# Patient Record
Sex: Female | Born: 1990 | Race: White | Hispanic: No | Marital: Single | Smoking: Current some day smoker
Health system: Southern US, Community
[De-identification: ages and names within clinical notes are randomized; demographics above are authoritative.]

## PROBLEM LIST (undated history)

## (undated) ENCOUNTER — Inpatient Hospital Stay (HOSPITAL_COMMUNITY): Payer: Self-pay

## (undated) DIAGNOSIS — Z789 Other specified health status: Secondary | ICD-10-CM

## (undated) HISTORY — PX: EYE SURGERY: SHX253

## (undated) HISTORY — PX: INDUCED ABORTION: SHX677

---

## 2012-04-11 ENCOUNTER — Encounter (HOSPITAL_COMMUNITY): Payer: Self-pay | Admitting: *Deleted

## 2012-04-11 ENCOUNTER — Emergency Department (HOSPITAL_COMMUNITY)
Admission: EM | Admit: 2012-04-11 | Discharge: 2012-04-12 | Disposition: A | Discharge status: Home / Self Care | Payer: Self-pay | Attending: Emergency Medicine | Admitting: Emergency Medicine

## 2012-04-11 DIAGNOSIS — F458 Other somatoform disorders: Secondary | ICD-10-CM | POA: Insufficient documentation

## 2012-04-11 DIAGNOSIS — R51 Headache: Secondary | ICD-10-CM | POA: Insufficient documentation

## 2012-04-11 DIAGNOSIS — F444 Conversion disorder with motor symptom or deficit: Secondary | ICD-10-CM

## 2012-04-11 DIAGNOSIS — R1110 Vomiting, unspecified: Secondary | ICD-10-CM | POA: Insufficient documentation

## 2012-04-11 DIAGNOSIS — F17200 Nicotine dependence, unspecified, uncomplicated: Secondary | ICD-10-CM | POA: Insufficient documentation

## 2012-04-11 LAB — URINE MICROSCOPIC-ADD ON

## 2012-04-11 LAB — URINALYSIS, ROUTINE W REFLEX MICROSCOPIC
Bilirubin Urine: NEGATIVE
Glucose, UA: NEGATIVE mg/dL
Ketones, ur: NEGATIVE mg/dL
Nitrite: NEGATIVE
Protein, ur: NEGATIVE mg/dL

## 2012-04-11 NOTE — ED Provider Notes (Signed)
History     CSN: [id number]  Arrival date & time 04/11/12  2040   First MD Initiated Contact with Patient 04/11/12 2256      Chief Complaint  Patient presents with  . shaking all over     (Consider location/radiation/quality/duration/timing/severity/associated sxs/prior treatment) The history is provided by the patient and medical records. No language interpreter was used.    Allison [name] is a 22 y.o. female  with no medical Hx presents to the Emergency Department complaining of gradual, persistent, leg tremors beginning this evening. Patient states she had one episode of vomiting after eating dinner associated with a brief episode of left lower cautery pain which has resolved.  Patient states her legs began to shake approximately 3 hours ago and she's been unable to control them. Patient has no history of seizures is no family history of seizure disorder. Patient is at everyday smoker, denies alcohol intake last night but denies street drug use. Patient also has mild lower back pain and headache which has now resolved.. She denies episodes of bowel or bladder incontinence, saddle anesthesia. Patient boyfriend states that she is not walking normally. Nothing makes it better and nothing makes it worse.  Pt denies fever, chills, neck pain, chest pain, shortness of breath, nausea, diarrhea, weakness, dizziness, syncope, numbness, tingling, dysuria, hematuria.  Patient is up-to-date on her immunizations as far she knows.  She's had no recent illness.     History reviewed. No pertinent past medical history.  History reviewed. No pertinent past surgical history.  No family history on file.  History  Substance Use Topics  . Smoking status: Current Every Day Smoker  . Smokeless tobacco: Not on file  . Alcohol Use: No    OB History   Grav Para Term Preterm Abortions TAB SAB Ect Mult Living                  Review of Systems  Constitutional: Negative for fever, diaphoresis, appetite  change, fatigue and unexpected weight change.  HENT: Negative for mouth sores and neck stiffness.   Eyes: Negative for visual disturbance.  Respiratory: Negative for cough, chest tightness, shortness of breath and wheezing.   Cardiovascular: Negative for chest pain.  Gastrointestinal: Positive for vomiting (x1). Negative for nausea, abdominal pain, diarrhea and constipation.  Endocrine: Negative for polydipsia, polyphagia and polyuria.  Genitourinary: Negative for dysuria, urgency, frequency and hematuria.  Musculoskeletal: Negative for back pain.  Skin: Negative for rash.  Allergic/Immunologic: Negative for immunocompromised state.  Neurological: Positive for tremors and headaches (now resolved). Negative for syncope and light-headedness.  Hematological: Does not bruise/bleed easily.  Psychiatric/Behavioral: Negative for sleep disturbance. The patient is not nervous/anxious.     Allergies  Review of patient's allergies indicates no known allergies.  Home Medications  No current outpatient prescriptions on file.  BP 123/74  [name] 74  Temp(Src) 98.4 F (36.9 C) (Oral)  Resp 16  SpO2 100%  LMP 03/17/2012  Physical Exam  Nursing note and vitals reviewed. Constitutional: She is oriented to person, place, and time. She appears well-developed and well-nourished. No distress.  HENT:  Head: Normocephalic and atraumatic.  Right Ear: Tympanic membrane, external ear and ear canal normal.  Left Ear: Tympanic membrane, external ear and ear canal normal.  Nose: Nose normal. No mucosal edema or rhinorrhea.  Mouth/Throat: Uvula is midline, oropharynx is clear and moist and mucous membranes are normal. Normal dentition. No edematous. No oropharyngeal exudate, posterior oropharyngeal edema, posterior oropharyngeal erythema or tonsillar abscesses.  Eyes: Conjunctivae and EOM are normal. Pupils are equal, round, and reactive to light. No scleral icterus. Right eye exhibits no nystagmus. Left eye  exhibits no nystagmus.  Neck: Normal range of motion. Neck supple.  Cardiovascular: Normal rate, regular rhythm, normal heart sounds and intact distal pulses.   Capillary refill less than 3 seconds  Pulmonary/Chest: Effort normal and breath sounds normal. No respiratory distress. She has no wheezes. She has no rales. She exhibits no tenderness.  Abdominal: Soft. Bowel sounds are normal. She exhibits no distension and no mass. There is no tenderness. There is no rebound and no guarding.  Musculoskeletal: Normal range of motion. She exhibits tenderness. She exhibits no edema.  Mild L-spine paraspinal tenderness Full range of motion of the L-spine  Lymphadenopathy:    She has no cervical adenopathy.  Neurological: She is alert and oriented to person, place, and time. She has normal reflexes. No cranial nerve deficit. She exhibits normal muscle tone. Coordination normal.  Speech is clear and goal oriented, follows commands Major Cranial nerves without deficit, no facial droop Normal strength in upper and lower extremities bilaterally including dorsiflexion and plantar flexion, strong and equal grip strength Sensation normal to light and sharp touch Coordination intact Normal finger to nose and rapid alternating movements Neg romberg, no pronator drift Normal balance Pt walks without difficulty but tremor in legs is noted as she ambulates   Skin: Skin is warm and dry. No rash noted. She is not diaphoretic. No erythema.  Psychiatric: She has a normal mood and affect. Her behavior is normal.    ED Course  Procedures (including critical care time)  Labs Reviewed  URINALYSIS, ROUTINE W REFLEX MICROSCOPIC - Abnormal; Notable for the following:    APPearance TURBID (*)    Leukocytes, UA LARGE (*)    All other components within normal limits  URINE MICROSCOPIC-ADD ON - Abnormal; Notable for the following:    Squamous Epithelial / LPF MANY (*)    Bacteria, UA MANY (*)    All other components  within normal limits  URINE CULTURE  PREGNANCY, URINE  URINE RAPID DRUG SCREEN (HOSP PERFORMED)  POCT I-STAT, CHEM 8   No results found.   1. Psychogenic tremor       MDM  Allison [name] presents with several complaints with her biggest concern being the tremor in her legs.  Pt with normal neurologic exam, but visible tremor in her legs even with walking. This tremor is not intention in nature.  Pt denies drug use and her UDS is negative.  Chem 8 unremarkable.  Pt with contaminated urine with WBCs, bacteria and leukocytes; no nitrites and pt is without dysuria, hematuria, frequency, urgency or other urinary complaints. No CVA tenderness on exam.  Pt also denies vaginal discharge or itching.  Will culture urine and treat based on culture results.  Pregnancy test negative.  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  Pt will be given f/u with neurology as needed if symptoms worsen. Discussed at length stress reduction techniques. I have also discussed reasons to return immediately to the ER.  Patient expresses understanding and agrees with plan.          Dahlia Client Albertine Lafoy, PA-C 04/12/12 [phone]

## 2012-04-11 NOTE — ED Notes (Signed)
The pt reports that she has been shaking for the past 3 hours.  She last ate at 1430  Today.  She vomited once earlier today and earlier today she had some llq pain with radiation down her lt leg.  lmp march 11

## 2012-04-12 DIAGNOSIS — F458 Other somatoform disorders: Secondary | ICD-10-CM

## 2012-04-12 LAB — POCT I-STAT, CHEM 8
BUN: 7 mg/dL (ref 6–23)
Calcium, Ion: 1.21 mmol/L (ref 1.12–1.23)
HCT: 41 % (ref 36.0–46.0)
Hemoglobin: 13.9 g/dL (ref 12.0–15.0)
Sodium: 140 mEq/L (ref 135–145)
TCO2: 25 mmol/L (ref 0–100)

## 2012-04-12 LAB — RAPID URINE DRUG SCREEN, HOSP PERFORMED
Barbiturates: NOT DETECTED
Benzodiazepines: NOT DETECTED
Cocaine: NOT DETECTED
Tetrahydrocannabinol: NOT DETECTED

## 2012-04-12 LAB — GLUCOSE, CAPILLARY: Glucose-Capillary: 78 mg/dL (ref 70–99)

## 2012-04-12 NOTE — ED Notes (Signed)
Neurology at bedside.

## 2012-04-12 NOTE — ED Provider Notes (Signed)
Medical screening examination/treatment/procedure(s) were performed by non-physician practitioner and as supervising physician I was immediately available for consultation/collaboration.  Sunnie Nielsen, MD 04/12/12 [phone]

## 2012-04-12 NOTE — Consult Note (Signed)
Reason for Consult:Abnormal Lower extremity movements.  Referring Physician: Dierdre Highman, B  CC: Lower extremity shaking.   History is obtained from:Patient  HPI: Allison [name] is a 22 y.o. female with no significant past medical history who was in the shower earlier tonight and noticed a left groin pain that shot down into her leg and then subsequently noticed that her legs were shaking. She states that they were shaking so bad earlier that she could not continue to stand in the shower. Since that time, she continues to have shaking of her legs.    ROS: A 14 point ROS was performed and is negative except as noted in the HPI.  History reviewed. No pertinent past medical history.  Social History: Tob: + smoker  Exam: Current vital signs: BP 123/74  Pulse 74  Temp(Src) 98.4 F (36.9 C) (Oral)  Resp 16  SpO2 100%  LMP 03/17/2012 Vital signs in last 24 hours: Temp:  [98.4 F (36.9 C)] 98.4 F (36.9 C) (04/05 2051) Pulse Rate:  [73-84] 74 (04/06 0045) Resp:  [16] 16 (04/05 2051) BP: (119-141)/(74-91) 123/74 mmHg (04/06 0045) SpO2:  [100 %] 100 % (04/06 0045)  General: in bed, nad CV: rrr Mental Status: Patient is awake, alert, oriented to person, place, month, year, and situation. Immediate and remote memory are intact. Patient is able to give a clear and coherent history. No signs of aphasia or neglect.  Cranial Nerves: II: Visual Fields are full. Pupils are equal, round, and reactive to light.  Discs are sharp.  III,IV, VI: EOMI without ptosis or diploplia.  V: Facial sensation is symmetric to temperature VII: Facial movement is symmetric.  VIII: hearing is intact to voice X: Uvula elevates symmetrically XI: Shoulder shrug is symmetric. XII: tongue is midline without atrophy or fasciculations.  Motor: Tone is normal. Bulk is normal. 5/5 strength was present in all four extremities. She has an irregular tremor of the bilateral lower extremities that is arhythmic,  dyssynchronous, and clearly distractible.  Sensory: Sensation is symmetric to light touch in the arms and legs. Deep Tendon Reflexes: 2+ and symmetric in the biceps and patellae.  Cerebellar: FNF  intact bilaterally Gait: Patient able to stand on tiptoes and take a couple of steps, more detailed testing difficult due to monitors.    I have reviewed labs in epic and the results pertinent to this consultation are: BMP - wnl UDS - negative  Impression: 22 yo F with distractible lower extremity tremor. The character of the tremor, bilateral location,  lack of associated symptoms and distractible nature argue for a psychogenic nature to this illness. I do not feel that any further evaluation is needed at this time.   Recommendations: 1) Patient could follow up with neurology as outpatient if she continues to have symptoms.    Ritta Slot, MD [facility] [phone]  If 7pm- 7am, please page neurology on call at [phone].

## 2012-04-13 LAB — URINE CULTURE: Colony Count: 40000

## 2012-04-14 ENCOUNTER — Telehealth (HOSPITAL_COMMUNITY): Payer: Self-pay | Admitting: Emergency Medicine

## 2012-04-15 ENCOUNTER — Telehealth (HOSPITAL_COMMUNITY): Payer: Self-pay | Admitting: Emergency Medicine

## 2012-08-29 ENCOUNTER — Encounter (HOSPITAL_BASED_OUTPATIENT_CLINIC_OR_DEPARTMENT_OTHER): Payer: Self-pay

## 2012-08-29 ENCOUNTER — Emergency Department (HOSPITAL_BASED_OUTPATIENT_CLINIC_OR_DEPARTMENT_OTHER)
Admission: EM | Admit: 2012-08-29 | Discharge: 2012-08-29 | Disposition: A | Discharge status: Home / Self Care | Payer: Self-pay | Attending: Emergency Medicine | Admitting: Emergency Medicine

## 2012-08-29 DIAGNOSIS — R630 Anorexia: Secondary | ICD-10-CM | POA: Insufficient documentation

## 2012-08-29 DIAGNOSIS — R0602 Shortness of breath: Secondary | ICD-10-CM | POA: Insufficient documentation

## 2012-08-29 DIAGNOSIS — Z3202 Encounter for pregnancy test, result negative: Secondary | ICD-10-CM | POA: Insufficient documentation

## 2012-08-29 DIAGNOSIS — R059 Cough, unspecified: Secondary | ICD-10-CM | POA: Insufficient documentation

## 2012-08-29 DIAGNOSIS — F17200 Nicotine dependence, unspecified, uncomplicated: Secondary | ICD-10-CM | POA: Insufficient documentation

## 2012-08-29 DIAGNOSIS — J3489 Other specified disorders of nose and nasal sinuses: Secondary | ICD-10-CM | POA: Insufficient documentation

## 2012-08-29 DIAGNOSIS — R109 Unspecified abdominal pain: Secondary | ICD-10-CM | POA: Insufficient documentation

## 2012-08-29 DIAGNOSIS — R05 Cough: Secondary | ICD-10-CM | POA: Insufficient documentation

## 2012-08-29 LAB — URINE MICROSCOPIC-ADD ON

## 2012-08-29 LAB — URINALYSIS, ROUTINE W REFLEX MICROSCOPIC
Bilirubin Urine: NEGATIVE
Ketones, ur: NEGATIVE mg/dL
Nitrite: NEGATIVE
Protein, ur: NEGATIVE mg/dL
Urobilinogen, UA: 1 mg/dL (ref 0.0–1.0)

## 2012-08-29 MED ORDER — IBUPROFEN 400 MG PO TABS
600.0000 mg | ORAL_TABLET | Freq: Once | ORAL | Status: AC
  Administered 2012-08-29: 600 mg via ORAL
  Filled 2012-08-29: qty 1

## 2012-08-29 MED ORDER — IBUPROFEN 600 MG PO TABS: 600.0000 mg | ORAL_TABLET | Freq: Three times a day (TID) | ORAL | Status: DC | PRN

## 2012-08-29 MED ORDER — HYDROCODONE-ACETAMINOPHEN 5-325 MG PO TABS
1.0000 | ORAL_TABLET | Freq: Once | ORAL | Status: AC
  Administered 2012-08-29: 1 via ORAL
  Filled 2012-08-29: qty 1

## 2012-08-29 NOTE — ED Notes (Signed)
Patient here with abdominal cramping x 3 days. Reports that she thinks it is menstrual cramps but just finished cycle 2 weeks ago. Reports that the pain is radiating down her legs

## 2012-08-29 NOTE — ED Provider Notes (Signed)
CSN: [id number]     Arrival date & time 08/29/12  1845 History    This chart was scribed for Lyanne Co, MD,  by Ashley Jacobs, ED Scribe. The patient was seen in room MHOTF/OTF and the patient's care was started at 9:05 PM.   First MD Initiated Contact with Patient 08/29/12 2029     Chief Complaint  Patient presents with  . Abdominal Cramping   HPI Patient is a 22 y.o. female presenting with cramps.  Abdominal Cramping This is a new problem. The current episode started more than 2 days ago. The problem occurs constantly. The problem has not changed since onset.Associated symptoms include abdominal pain and shortness of breath. Pertinent negatives include no chest pain. She has tried nothing for the symptoms.  HPI Comments: Allison [name] is a 22 y.o. female who presents to the Emergency Department complaining of abdominal cramping with initial onset of three days ago and has not resolved. Pt mention feeling intermittent moderate pain in her right abdomen and suprapubic region. She reports having a her menses 2 weeks ago and reports that the pain she is experiencing is not similar in nature. She did not take any medication for pain and reports that nothing seems to relieve or worsen symptoms.Pt does not have any know allergies.  She denies dysuria.  No vaginal complaints.  No vaginal discharge.  History reviewed. No pertinent past medical history. History reviewed. No pertinent past surgical history. No family history on file. History  Substance Use Topics  . Smoking status: Current Every Day Smoker  . Smokeless tobacco: Not on file  . Alcohol Use: No   OB History   Grav Para Term Preterm Abortions TAB SAB Ect Mult Living                 Review of Systems  Constitutional: Positive for appetite change. Negative for fever and chills.  HENT: Positive for congestion.   Respiratory: Positive for cough and shortness of breath.   Cardiovascular: Negative for chest pain.   Gastrointestinal: Positive for abdominal pain. Negative for nausea, vomiting and diarrhea.  Genitourinary: Negative for dysuria, frequency, vaginal discharge, vaginal pain, menstrual problem and dyspareunia.  Musculoskeletal: Negative for back pain.  All other systems reviewed and are negative.    Allergies  Review of patient's allergies indicates no known allergies.  Home Medications   Current Outpatient Rx  Name  Route  Sig  Dispense  Refill  . ibuprofen (ADVIL,MOTRIN) 600 MG tablet   Oral   Take 1 tablet (600 mg total) by mouth every 8 (eight) hours as needed for pain.   15 tablet   0    BP 129/71  Pulse 100  Temp(Src) 98.6 F (37 C) (Oral)  Resp 18  Wt 112 lb (50.803 kg)  SpO2 99%  LMP 08/15/2012 Physical Exam  Nursing note and vitals reviewed. Constitutional: She is oriented to person, place, and time. She appears well-developed and well-nourished. No distress.  HENT:  Head: Normocephalic and atraumatic.  Eyes: Conjunctivae and EOM are normal. Pupils are equal, round, and reactive to light.  Neck: Normal range of motion. Neck supple.  Cardiovascular: Normal rate.   Pulmonary/Chest: Effort normal.  Abdominal: Soft. Bowel sounds are normal. She exhibits no distension. There is no rebound and no guarding.  Musculoskeletal: Normal range of motion.  Neurological: She is alert and oriented to person, place, and time.  Skin: Skin is warm and dry.  Psychiatric: She has a normal mood and affect.  Her behavior is normal.    ED Course  DIAGNOSTIC STUDIES: Oxygen Saturation is 99% on room air, normal by my interpretation.    COORDINATION OF CARE: 9:08 PM Discussed course of care with pt . Pt understands and agrees.    Procedures (including critical care time)  Labs Reviewed  URINALYSIS, ROUTINE W REFLEX MICROSCOPIC - Abnormal; Notable for the following:    Leukocytes, UA SMALL (*)    All other components within normal limits  PREGNANCY, URINE  URINE  MICROSCOPIC-ADD ON   No results found. 1. Abdominal pain     MDM  Patient is overall well-appearing.  Her abdominal exam is benign.  There is no indication for imaging today.  Her urine shows no signs of infection.  Discharge home in good condition.  I've asked that she return to the ER for developing fever or worsening pain.   I personally performed the services described in this documentation, which was scribed in my presence. The recorded information has been reviewed and is accurate.      Lyanne Co, MD 08/29/12 [phone]

## 2012-08-30 ENCOUNTER — Emergency Department (HOSPITAL_BASED_OUTPATIENT_CLINIC_OR_DEPARTMENT_OTHER)
Admission: EM | Admit: 2012-08-30 | Discharge: 2012-08-30 | Disposition: A | Discharge status: Home / Self Care | Payer: Self-pay | Attending: Emergency Medicine | Admitting: Emergency Medicine

## 2012-08-30 ENCOUNTER — Encounter (HOSPITAL_BASED_OUTPATIENT_CLINIC_OR_DEPARTMENT_OTHER): Payer: Self-pay | Admitting: *Deleted

## 2012-08-30 DIAGNOSIS — F17200 Nicotine dependence, unspecified, uncomplicated: Secondary | ICD-10-CM | POA: Insufficient documentation

## 2012-08-30 DIAGNOSIS — Z3202 Encounter for pregnancy test, result negative: Secondary | ICD-10-CM | POA: Insufficient documentation

## 2012-08-30 DIAGNOSIS — R109 Unspecified abdominal pain: Secondary | ICD-10-CM | POA: Insufficient documentation

## 2012-08-30 LAB — URINALYSIS, ROUTINE W REFLEX MICROSCOPIC
Bilirubin Urine: NEGATIVE
Hgb urine dipstick: NEGATIVE
Ketones, ur: NEGATIVE mg/dL
Specific Gravity, Urine: 1.018 (ref 1.005–1.030)
pH: 7.5 (ref 5.0–8.0)

## 2012-08-30 LAB — CBC WITH DIFFERENTIAL/PLATELET
Eosinophils Absolute: 0.3 10*3/uL (ref 0.0–0.7)
HCT: 40.4 % (ref 36.0–46.0)
Hemoglobin: 13.5 g/dL (ref 12.0–15.0)
Lymphs Abs: 2.5 10*3/uL (ref 0.7–4.0)
MCH: 30.7 pg (ref 26.0–34.0)
MCHC: 33.4 g/dL (ref 30.0–36.0)
Monocytes Absolute: 0.7 10*3/uL (ref 0.1–1.0)
Monocytes Relative: 8 % (ref 3–12)
Neutrophils Relative %: 60 % (ref 43–77)
RBC: 4.4 MIL/uL (ref 3.87–5.11)

## 2012-08-30 LAB — BASIC METABOLIC PANEL
BUN: 9 mg/dL (ref 6–23)
Chloride: 103 mEq/L (ref 96–112)
Creatinine, Ser: 0.8 mg/dL (ref 0.50–1.10)
GFR calc non Af Amer: >90 mL/min (ref >90)
Glucose, Bld: 95 mg/dL (ref 70–99)
Potassium: 3.9 mEq/L (ref 3.5–5.1)

## 2012-08-30 NOTE — ED Notes (Addendum)
Patient was seen in this facility yesterday. States that her abd pain is worse today. Patient asked if she could drink her sweet tea now, informed her that she would need to wait until she saw the dr.

## 2012-08-30 NOTE — ED Provider Notes (Signed)
CSN: [id number]     Arrival date & time 08/30/12  1749 History     First MD Initiated Contact with Patient 08/30/12 1825     Chief Complaint  Patient presents with  . Abdominal Pain   (Consider location/radiation/quality/duration/timing/severity/associated sxs/prior Treatment) HPI Comments: Patient is a 22 year old female with no past medical history who presents with lower abdominal pain for the past 3 days. The pain is located in her lower abdomen and does not radiate. The pain is described as cramping and severe. The pain started gradually and progressively worsened since the onset. No alleviating/aggravating factors. The patient has tried nothing for symptoms without relief. Associated symptoms include nothing. Patient denies fever, headache, NVD, chest pain, SOB, dysuria, constipation, abnormal vaginal bleeding/discharge. LMP 2 weeks ago.     Patient is a 22 y.o. female presenting with abdominal pain.  Abdominal Pain   History reviewed. No pertinent past medical history. History reviewed. No pertinent past surgical history. History reviewed. No pertinent family history. History  Substance Use Topics  . Smoking status: Current Every Day Smoker  . Smokeless tobacco: Not on file  . Alcohol Use: No   OB History   Grav Para Term Preterm Abortions TAB SAB Ect Mult Living                 Review of Systems  Gastrointestinal: Positive for abdominal pain.  All other systems reviewed and are negative.    Allergies  Review of patient's allergies indicates no known allergies.  Home Medications   Current Outpatient Rx  Name  Route  Sig  Dispense  Refill  . ibuprofen (ADVIL,MOTRIN) 600 MG tablet   Oral   Take 1 tablet (600 mg total) by mouth every 8 (eight) hours as needed for pain.   15 tablet   0    BP 110/64  Pulse 85  Temp(Src) 98.3 F (36.8 C) (Oral)  Resp 20  SpO2 99%  LMP 08/15/2012 Physical Exam  Nursing note and vitals reviewed. Constitutional: She is  oriented to person, place, and time. She appears well-developed and well-nourished. No distress.  HENT:  Head: Normocephalic and atraumatic.  Eyes: Conjunctivae and EOM are normal.  Neck: Normal range of motion.  Cardiovascular: Normal rate and regular rhythm.  Exam reveals no gallop and no friction rub.   No murmur heard. Pulmonary/Chest: Effort normal and breath sounds normal. She has no wheezes. She has no rales. She exhibits no tenderness.  Abdominal: Soft. She exhibits no distension. There is tenderness. There is no rebound and no guarding.  Generalized lower abdominal tenderness to palpation. No focal tenderness to palpation or peritoneal signs.   Musculoskeletal: Normal range of motion.  Neurological: She is alert and oriented to person, place, and time.  Speech is goal-oriented. Moves limbs without ataxia.   Skin: Skin is warm and dry.  Psychiatric: She has a normal mood and affect. Her behavior is normal.    ED Course   Procedures (including critical care time)  Labs Reviewed  URINALYSIS, ROUTINE W REFLEX MICROSCOPIC - Abnormal; Notable for the following:    Leukocytes, UA SMALL (*)    All other components within normal limits  URINE MICROSCOPIC-ADD ON - Abnormal; Notable for the following:    Squamous Epithelial / LPF FEW (*)    All other components within normal limits  URINE CULTURE  CBC WITH DIFFERENTIAL  BASIC METABOLIC PANEL  PREGNANCY, URINE   No results found. 1. Abdominal pain     MDM  8:31 PM Labs unremarkable. Urinalysis shows no infection. Patient declined pelvic exam. She thinks her lower abdominal pain is due to muscle soreness from her working out. No further evaluation needed at this time. Patient will be discharged.   Emilia Beck, [place] 08/30/12 2035

## 2012-08-30 NOTE — ED Notes (Signed)
PA at bedside.

## 2012-09-01 LAB — URINE CULTURE: Colony Count: NO GROWTH

## 2012-09-03 NOTE — ED Provider Notes (Signed)
Medical screening examination/treatment/procedure(s) were performed by non-physician practitioner and as supervising physician I was immediately available for consultation/collaboration.  Mersades Barbaro, MD 09/03/12 0847 

## 2013-01-07 NOTE — L&D Delivery Note (Signed)
I was present for the delivery and agree with above.  Placenta to: BS Feeding: Breast Circ: NA Contraception: Nexplanon  Dorathy Kinsman, CNM 09/20/2013 10:55 AM

## 2013-01-07 NOTE — L&D Delivery Note (Signed)
Delivery Note MD called to room. Delivery went precipitously and RN delivery baby as MD entered room.  At 8:33 AM a viable female was delivered via Vaginal, Spontaneous Delivery (Presentation: Right Occiput Anterior).  APGAR:9 ,10 ; weight pending.   Placenta status: Intact, Spontaneous.  Cord: 3 vessel; with the following complications: none .  Cord pH: pending  Anesthesia:  Epidural  Episiotomy: None Lacerations: None Suture Repair: N/A Est. Blood Loss (mL): 150  Mom to postpartum.  Baby to Couplet care / Skin to Skin.  Joanna Puff 09/20/2013, 8:51 AM

## 2013-02-13 ENCOUNTER — Emergency Department (HOSPITAL_COMMUNITY): Payer: Medicaid Other

## 2013-02-13 ENCOUNTER — Encounter (HOSPITAL_COMMUNITY): Payer: Self-pay | Admitting: Emergency Medicine

## 2013-02-13 ENCOUNTER — Emergency Department (HOSPITAL_COMMUNITY)
Admission: EM | Admit: 2013-02-13 | Discharge: 2013-02-14 | Disposition: A | Discharge status: Home / Self Care | Payer: Medicaid Other | Attending: Emergency Medicine | Admitting: Emergency Medicine

## 2013-02-13 DIAGNOSIS — O99330 Smoking (tobacco) complicating pregnancy, unspecified trimester: Secondary | ICD-10-CM | POA: Insufficient documentation

## 2013-02-13 DIAGNOSIS — Z3490 Encounter for supervision of normal pregnancy, unspecified, unspecified trimester: Secondary | ICD-10-CM

## 2013-02-13 DIAGNOSIS — N390 Urinary tract infection, site not specified: Secondary | ICD-10-CM | POA: Insufficient documentation

## 2013-02-13 DIAGNOSIS — O2340 Unspecified infection of urinary tract in pregnancy, unspecified trimester: Secondary | ICD-10-CM

## 2013-02-13 DIAGNOSIS — O211 Hyperemesis gravidarum with metabolic disturbance: Secondary | ICD-10-CM

## 2013-02-13 DIAGNOSIS — O2390 Unspecified genitourinary tract infection in pregnancy, unspecified trimester: Secondary | ICD-10-CM | POA: Insufficient documentation

## 2013-02-13 LAB — URINALYSIS, ROUTINE W REFLEX MICROSCOPIC
Glucose, UA: NEGATIVE mg/dL
Hgb urine dipstick: NEGATIVE
NITRITE: NEGATIVE
PROTEIN: 30 mg/dL — AB
Specific Gravity, Urine: 1.034 — ABNORMAL HIGH (ref 1.005–1.030)
UROBILINOGEN UA: 1 mg/dL (ref 0.0–1.0)
pH: 5.5 (ref 5.0–8.0)

## 2013-02-13 LAB — URINE MICROSCOPIC-ADD ON

## 2013-02-13 LAB — CBC
HEMATOCRIT: 41.4 % (ref 36.0–46.0)
Hemoglobin: 14.7 g/dL (ref 12.0–15.0)
MCH: 31.7 pg (ref 26.0–34.0)
MCHC: 35.5 g/dL (ref 30.0–36.0)
MCV: 89.2 fL (ref 78.0–100.0)
PLATELETS: 435 10*3/uL — AB (ref 150–400)
RBC: 4.64 MIL/uL (ref 3.87–5.11)
RDW: 12.7 % (ref 11.5–15.5)
WBC: 12.1 10*3/uL — AB (ref 4.0–10.5)

## 2013-02-13 LAB — HCG, QUANTITATIVE, PREGNANCY: hCG, Beta Chain, Quant, S: 53523 m[IU]/mL — ABNORMAL HIGH (ref <5)

## 2013-02-13 LAB — BASIC METABOLIC PANEL
BUN: 13 mg/dL (ref 6–23)
CALCIUM: 9.8 mg/dL (ref 8.4–10.5)
CHLORIDE: 96 meq/L (ref 96–112)
CO2: 20 mEq/L (ref 19–32)
Creatinine, Ser: 0.62 mg/dL (ref 0.50–1.10)
GFR calc non Af Amer: >90 mL/min (ref >90)
Glucose, Bld: 72 mg/dL (ref 70–99)
Potassium: 3.8 mEq/L (ref 3.7–5.3)
Sodium: 135 mEq/L — ABNORMAL LOW (ref 137–147)

## 2013-02-13 LAB — POCT PREGNANCY, URINE: PREG TEST UR: POSITIVE — AB

## 2013-02-13 MED ORDER — PROMETHAZINE HCL 25 MG/ML IJ SOLN
12.5000 mg | Freq: Once | INTRAMUSCULAR | Status: AC
Start: 2013-02-13 — End: 2013-02-13
  Administered 2013-02-13: 12.5 mg via INTRAVENOUS
  Filled 2013-02-13: qty 1

## 2013-02-13 MED ORDER — DEXTROSE 5 % IV SOLN
1.0000 g | Freq: Once | INTRAVENOUS | Status: AC
  Administered 2013-02-13: 1 g via INTRAVENOUS
  Filled 2013-02-13: qty 10

## 2013-02-13 MED ORDER — LACTATED RINGERS IV SOLN
INTRAVENOUS | Status: DC
  Administered 2013-02-13: 21:00:00 via INTRAVENOUS

## 2013-02-13 MED ORDER — ONDANSETRON HCL 4 MG/2ML IJ SOLN
4.0000 mg | Freq: Once | INTRAMUSCULAR | Status: AC
  Administered 2013-02-13: 4 mg via INTRAVENOUS
  Filled 2013-02-13: qty 2

## 2013-02-13 MED ORDER — LACTATED RINGERS IV SOLN
INTRAVENOUS | Status: AC
  Administered 2013-02-13: 20:00:00 via INTRAVENOUS

## 2013-02-13 MED ORDER — FAMOTIDINE IN NACL 20-0.9 MG/50ML-% IV SOLN
20.0000 mg | Freq: Once | INTRAVENOUS | Status: AC
  Administered 2013-02-13: 20 mg via INTRAVENOUS
  Filled 2013-02-13: qty 50

## 2013-02-13 NOTE — ED Notes (Signed)
Emesis x week and half. Believes our pregnancy per home pregnancy tests.

## 2013-02-13 NOTE — ED Notes (Signed)
Portable US at bedside.

## 2013-02-13 NOTE — ED Notes (Signed)
Called US, spoke with Marylene Landngela, she reports the pt is the next on the list.

## 2013-02-13 NOTE — ED Notes (Signed)
Pt still c/o nausea, pt hasn't vomited. Reports burning sensation in stomach has unchanged.

## 2013-02-13 NOTE — ED Provider Notes (Signed)
CSN: [id number]     Arrival date & time 02/13/13  1918 History  This chart was scribed for non-physician practitioner Janne NapoleonHope M Neese, NP working with No att. providers found by Valera CastleSteven Perry, ED scribe. This patient was seen in room TR07C/TR07C and the patient's care was started at 7:36 PM.   Chief Complaint  Patient presents with  . Emesis  . Possible Pregnancy   The history is provided by the patient. No language interpreter was used.   HPI Comments: Allison [name] is a 23 y.o. female who presents to the Emergency Department complaining of nausea and multiple episodes of vomiting, onset 1.5 weeks ago. She reports vomiting more than 8 times a day. She reports difficulty keeping down food and liquids. She reports having nausea and vomiting with her previous pregnancy of her 543.23 year old son. She reports having a positive home pregnancy test 3 days ago. She reports having abortion 12/20/2012 and denies having a normal menstrual period since then. She was [redacted] weeks gestation at the time of the abortion. She has been sexually active without birth control since the abortion. She did not go for her follow up appointment after the procedure. She denies lower abdominal pain, only epigastric pain with vomiting. No other associated symptoms.   PCP - No PCP Per Patient  History reviewed. No pertinent past medical history. History reviewed. No pertinent past surgical history. No family history on file. History  Substance Use Topics  . Smoking status: Current Every Day Smoker  . Smokeless tobacco: Not on file  . Alcohol Use: No   OB History   Grav Para Term Preterm Abortions TAB SAB Ect Mult Living                 Review of Systems  Constitutional: Negative for fever and chills.  HENT: Negative.   Eyes: Negative for visual disturbance.  Respiratory: Negative for cough and shortness of breath.   Cardiovascular: Negative for chest pain.  Gastrointestinal: Positive for nausea, vomiting and abdominal pain  (upper). Negative for diarrhea and constipation.  Genitourinary: Positive for frequency. Negative for dysuria, urgency, vaginal bleeding and vaginal discharge.  Musculoskeletal: Negative for back pain and myalgias.  Skin: Negative for rash.  Neurological: Negative for headaches.   Allergies  Review of patient's allergies indicates no known allergies.  Home Medications   No current outpatient prescriptions on file. BP 132/74  Pulse 74  Temp(Src) 98.7 F (37.1 C) (Oral)  Resp 18  Wt 112 lb (50.803 kg)  SpO2 98%  LMP 02/13/2013  Physical Exam  Nursing note and vitals reviewed. Constitutional: She is oriented to person, place, and time. She appears well-developed and well-nourished. No distress.  HENT:  Head: Normocephalic and atraumatic.  Right Ear: External ear normal.  Left Ear: External ear normal.  Nose: Nose normal.  Mouth/Throat: Oropharynx is clear and moist. No oropharyngeal exudate.  Eyes: Conjunctivae and EOM are normal. Pupils are equal, round, and reactive to light. No scleral icterus.  Neck: Neck supple. No tracheal deviation present. No thyromegaly present.  Cardiovascular: Normal rate, regular rhythm and normal heart sounds.   No murmur heard. Pulmonary/Chest: Effort normal. No respiratory distress. She has no wheezes. She has no rales.  Abdominal: Soft. There is tenderness (epigastric). There is no CVA tenderness.  Musculoskeletal: Normal range of motion.  Lymphadenopathy:    She has no cervical adenopathy.  Neurological: She is alert and oriented to person, place, and time.  Skin: Skin is warm and dry.  Psychiatric: She has a normal mood and affect. Her behavior is normal.    ED Course  Procedures (including critical care time)  DIAGNOSTIC STUDIES: Oxygen Saturation is 98% on room air, normal by my interpretation.    COORDINATION OF CARE: 7:41 PM-Discussed treatment plan which includes further evaluation with pt at bedside and pt agreed to plan.    Results for orders placed during the hospital encounter of 02/13/13  URINALYSIS, ROUTINE W REFLEX MICROSCOPIC      Result Value Range   Color, Urine AMBER (*) YELLOW   APPearance CLOUDY (*) CLEAR   Specific Gravity, Urine 1.034 (*) 1.005 - 1.030   pH 5.5  5.0 - 8.0   Glucose, UA NEGATIVE  NEGATIVE mg/dL   Hgb urine dipstick NEGATIVE  NEGATIVE   Bilirubin Urine SMALL (*) NEGATIVE   Ketones, ur >80 (*) NEGATIVE mg/dL   Protein, ur 30 (*) NEGATIVE mg/dL   Urobilinogen, UA 1.0  0.0 - 1.0 mg/dL   Nitrite NEGATIVE  NEGATIVE   Leukocytes, UA LARGE (*) NEGATIVE  BASIC METABOLIC PANEL      Result Value Range   Sodium 135 (*) 137 - 147 mEq/L   Potassium 3.8  3.7 - 5.3 mEq/L   Chloride 96  96 - 112 mEq/L   CO2 20  19 - 32 mEq/L   Glucose, Bld 72  70 - 99 mg/dL   BUN 13  6 - 23 mg/dL   Creatinine, Ser 1.61  0.50 - 1.10 mg/dL   Calcium 9.8  8.4 - 09.6 mg/dL   GFR calc non Af Amer >90  >90 mL/min   GFR calc Af Amer >90  >90 mL/min  CBC      Result Value Range   WBC 12.1 (*) 4.0 - 10.5 K/uL   RBC 4.64  3.87 - 5.11 MIL/uL   Hemoglobin 14.7  12.0 - 15.0 g/dL   HCT 04.5  40.9 - 81.1 %   MCV 89.2  78.0 - 100.0 fL   MCH 31.7  26.0 - 34.0 pg   MCHC 35.5  30.0 - 36.0 g/dL   RDW 91.4  78.2 - 95.6 %   Platelets 435 (*) 150 - 400 K/uL  HCG, QUANTITATIVE, PREGNANCY      Result Value Range   hCG, Beta Chain, Quant, S 53523 (*) <5 mIU/mL  URINE MICROSCOPIC-ADD ON      Result Value Range   Squamous Epithelial / LPF MANY (*) RARE   WBC, UA 21-50  <3 WBC/hpf   Bacteria, UA MANY (*) RARE   Urine-Other MUCOUS PRESENT    POCT PREGNANCY, URINE      Result Value Range   Preg Test, Ur POSITIVE (*) NEGATIVE   IV hydration, Zofran, Phenergan, Pepcid and Rocephin 1 gram IV.   US shows IUGS with YS and FP, heart rate of 120. Pregnancy measures 6 weeks 5 days with EDC of 10/04/13.  Urine culture pending.   MDM  23 y.o. female with nausea and vomiting in early pregnancy. Mild dehydration and UTI.  Feeling much better after IV hydration, Zofran, Phenergan and Pepcid. Stable for discharge without any further screening at this time will treat nausea and UTI. I have reviewed this patient's vital signs, nurses notes, appropriate labs and imaging.  I have discussed findings and plan of care with the patient and she voices understanding. Pregnancy verification letter given to the patient and she will start prenatal care with the health department.    Medication List  cephALEXin 500 MG capsule  Commonly known as:  KEFLEX  Take 1 capsule (500 mg total) by mouth 3 (three) times daily.     promethazine 25 MG tablet  Commonly known as:  PHENERGAN  Take 1 tablet (25 mg total) by mouth every 6 (six) hours as needed for nausea or vomiting.        I personally performed the services described in this documentation, which was scribed in my presence. The recorded information has been reviewed and is accurate.    [address] [place], [place] 02/14/13 [phone]

## 2013-02-13 NOTE — ED Notes (Signed)
Pt reports she is about [redacted] weeks pregnant, took a home pregnancy test to confirm. C/o of extreme n/v X 1.5 weeks. Denies taking any medication for it, denies prenatal vitamins. sts she doesn't have health insurance so she doesn't have an appointment set up with an OB doctor yet. Pt also c/o intermittent HA and generalized body aches. Reports after eating she always vomits. Nad, skin warm and dry, resp e/u. Denies use of birth control. sts had an abortion in December.

## 2013-02-14 MED ORDER — CEPHALEXIN 500 MG PO CAPS: 500.0000 mg | ORAL_CAPSULE | Freq: Three times a day (TID) | ORAL | Status: DC

## 2013-02-14 MED ORDER — PROMETHAZINE HCL 25 MG PO TABS: 25.0000 mg | ORAL_TABLET | Freq: Four times a day (QID) | ORAL | Status: DC | PRN

## 2013-02-14 NOTE — ED Provider Notes (Signed)
Medical screening examination/treatment/procedure(s) were performed by non-physician practitioner and as supervising physician I was immediately available for consultation/collaboration.  EKG Interpretation   None         David Masneri, MD 02/14/13 1548 

## 2013-02-15 LAB — URINE CULTURE

## 2013-02-19 ENCOUNTER — Encounter (HOSPITAL_COMMUNITY): Payer: Self-pay | Admitting: *Deleted

## 2013-02-19 ENCOUNTER — Inpatient Hospital Stay (HOSPITAL_COMMUNITY)
Admission: AD | Admit: 2013-02-19 | Discharge: 2013-02-20 | Disposition: A | Discharge status: Home / Self Care | Payer: Medicaid Other | Source: Ambulatory Visit | Attending: Obstetrics and Gynecology | Admitting: Obstetrics and Gynecology

## 2013-02-19 DIAGNOSIS — E860 Dehydration: Secondary | ICD-10-CM | POA: Insufficient documentation

## 2013-02-19 DIAGNOSIS — K5900 Constipation, unspecified: Secondary | ICD-10-CM | POA: Diagnosis present

## 2013-02-19 DIAGNOSIS — R51 Headache: Secondary | ICD-10-CM | POA: Insufficient documentation

## 2013-02-19 DIAGNOSIS — O210 Mild hyperemesis gravidarum: Secondary | ICD-10-CM | POA: Insufficient documentation

## 2013-02-19 DIAGNOSIS — O99330 Smoking (tobacco) complicating pregnancy, unspecified trimester: Secondary | ICD-10-CM | POA: Insufficient documentation

## 2013-02-19 DIAGNOSIS — O219 Vomiting of pregnancy, unspecified: Secondary | ICD-10-CM

## 2013-02-19 MED ORDER — HYDROMORPHONE HCL PF 1 MG/ML IJ SOLN
0.5000 mg | Freq: Once | INTRAMUSCULAR | Status: AC
  Administered 2013-02-20: 0.5 mg via INTRAVENOUS
  Filled 2013-02-19: qty 1

## 2013-02-19 MED ORDER — PROMETHAZINE HCL 25 MG/ML IJ SOLN
25.0000 mg | Freq: Once | INTRAMUSCULAR | Status: AC
  Administered 2013-02-20: 25 mg via INTRAVENOUS
  Filled 2013-02-19: qty 1

## 2013-02-19 NOTE — MAU Note (Signed)
Patient present with complaints of back pain and vomiting. Patient states she vomited 18 times today, has not been able to eat in the last three days. C/o of headache since last night,  blurred vision and sensitivity to light. Has not taken anything for headache. Was seen in [facility] ED last Friday and given a Rx for abx for UTI and phenergan for nausea but unable to get filled yet.

## 2013-02-20 DIAGNOSIS — O219 Vomiting of pregnancy, unspecified: Secondary | ICD-10-CM | POA: Diagnosis present

## 2013-02-20 DIAGNOSIS — K5900 Constipation, unspecified: Secondary | ICD-10-CM | POA: Diagnosis present

## 2013-02-20 DIAGNOSIS — O210 Mild hyperemesis gravidarum: Secondary | ICD-10-CM

## 2013-02-20 LAB — URINALYSIS, ROUTINE W REFLEX MICROSCOPIC
Glucose, UA: NEGATIVE mg/dL
Ketones, ur: >80 mg/dL — AB
NITRITE: NEGATIVE
PROTEIN: 30 mg/dL — AB
UROBILINOGEN UA: 1 mg/dL (ref 0.0–1.0)
pH: 6 (ref 5.0–8.0)

## 2013-02-20 LAB — URINE MICROSCOPIC-ADD ON

## 2013-02-20 MED ORDER — PROMETHAZINE HCL 25 MG PO TABS: 25.0000 mg | ORAL_TABLET | Freq: Four times a day (QID) | ORAL | Status: DC | PRN

## 2013-02-20 MED ORDER — POLYETHYLENE GLYCOL 3350 17 G PO PACK: 17.0000 g | PACK | Freq: Every day | ORAL | Status: DC

## 2013-02-20 MED ORDER — DEXTROSE IN LACTATED RINGERS 5 % IV SOLN
INTRAVENOUS | Status: DC
  Administered 2013-02-20: 03:00:00 via INTRAVENOUS

## 2013-02-20 MED ORDER — FAMOTIDINE IN NACL 20-0.9 MG/50ML-% IV SOLN
20.0000 mg | Freq: Once | INTRAVENOUS | Status: AC
  Administered 2013-02-20: 20 mg via INTRAVENOUS
  Filled 2013-02-20: qty 50

## 2013-02-20 MED ORDER — METOCLOPRAMIDE HCL 5 MG/ML IJ SOLN
5.0000 mg | Freq: Once | INTRAMUSCULAR | Status: AC
  Administered 2013-02-20: 5 mg via INTRAVENOUS
  Filled 2013-02-20 (×2): qty 2

## 2013-02-20 MED ORDER — DEXTROSE IN LACTATED RINGERS 5 % IV SOLN
INTRAVENOUS | Status: DC
  Administered 2013-02-20: 500 mL via INTRAVENOUS

## 2013-02-20 NOTE — Discharge Instructions (Signed)
Constipation, Adult Constipation is when a person:  Poops (bowel movement) less than 3 times a week.  Has a hard time pooping.  Has poop that is dry, hard, or bigger than normal. HOME CARE   Eat more fiber, such as fruits, vegetables, whole grains like brown rice, and beans.  Eat less fatty foods and sugar. This includes [place] fries, hamburgers, cookies, candy, and soda.  If you are not getting enough fiber from food, take products with added fiber in them (supplements).  Drink enough fluid to keep your pee (urine) clear or pale yellow.  Go to the restroom when you feel like you need to poop. Do not hold it.  Only take medicine as told by your doctor. Do not take medicines that help you poop (laxatives) without talking to your doctor first.  Exercise on a regular basis, or as told by your doctor. GET HELP RIGHT AWAY IF:   You have bright red blood in your poop (stool).  Your constipation lasts more than 4 days or gets worse.  You have belly (abdomen) or butt (rectal) pain.  You have thin poop (as thin as a pencil).  You lose weight, and it cannot be explained. MAKE SURE YOU:   Understand these instructions.  Will watch your condition.  Will get help right away if you are not doing well or get worse. Document Released: 06/12/2007 Document Revised: 03/18/2011 Document Reviewed: 10/05/2012 [facility] Patient Information 2014 [place], [place].  Dehydration, Adult Dehydration is when you lose more fluids from the body than you take in. Vital organs like the kidneys, brain, and heart cannot function without a proper amount of fluids and salt. Any loss of fluids from the body can cause dehydration.  CAUSES   Vomiting.  Diarrhea.  Excessive sweating.  Excessive urine output.  Fever. SYMPTOMS  Mild dehydration  Thirst.  Dry lips.  Slightly dry mouth. Moderate dehydration  Very dry mouth.  Sunken eyes.  Skin does not bounce back quickly when lightly pinched  and released.  Dark urine and decreased urine production.  Decreased tear production.  Headache. Severe dehydration  Very dry mouth.  Extreme thirst.  Rapid, weak pulse (more than 100 beats per minute at rest).  Cold hands and feet.  Not able to sweat in spite of heat and temperature.  Rapid breathing.  Blue lips.  Confusion and lethargy.  Difficulty being awakened.  Minimal urine production.  No tears. DIAGNOSIS  Your caregiver will diagnose dehydration based on your symptoms and your exam. Blood and urine tests will help confirm the diagnosis. The diagnostic evaluation should also identify the cause of dehydration. TREATMENT  Treatment of mild or moderate dehydration can often be done at home by increasing the amount of fluids that you drink. It is best to drink small amounts of fluid more often. Drinking too much at one time can make vomiting worse. Refer to the home care instructions below. Severe dehydration needs to be treated at the hospital where you will probably be given intravenous (IV) fluids that contain water and electrolytes. HOME CARE INSTRUCTIONS   Ask your caregiver about specific rehydration instructions.  Drink enough fluids to keep your urine clear or pale yellow.  Drink small amounts frequently if you have nausea and vomiting.  Eat as you normally do.  Avoid:  Foods or drinks high in sugar.  Carbonated drinks.  Juice.  Extremely hot or cold fluids.  Drinks with caffeine.  Fatty, greasy foods.  Alcohol.  Tobacco.  Overeating.  Gelatin desserts.  Wash your hands well to avoid spreading bacteria and viruses.  Only take over-the-counter or prescription medicines for pain, discomfort, or fever as directed by your caregiver.  Ask your caregiver if you should continue all prescribed and over-the-counter medicines.  Keep all follow-up appointments with your caregiver. SEEK MEDICAL CARE IF:  You have abdominal pain and it  increases or stays in one area (localizes).  You have a rash, stiff neck, or severe headache.  You are irritable, sleepy, or difficult to awaken.  You are weak, dizzy, or extremely thirsty. SEEK IMMEDIATE MEDICAL CARE IF:   You are unable to keep fluids down or you get worse despite treatment.  You have frequent episodes of vomiting or diarrhea.  You have blood or green matter (bile) in your vomit.  You have blood in your stool or your stool looks black and tarry.  You have not urinated in 6 to 8 hours, or you have only urinated a small amount of very dark urine.  You have a fever.  You faint. MAKE SURE YOU:   Understand these instructions.  Will watch your condition.  Will get help right away if you are not doing well or get worse. Document Released: 12/24/2004 Document Revised: 03/18/2011 Document Reviewed: 08/13/2010 [facility] Patient Information 2014 [place]. Morning Sickness Morning sickness is when you feel sick to your stomach (nauseous) during pregnancy. This nauseous feeling may or may not come with vomiting. It often occurs in the morning but can be a problem any time of day. Morning sickness is most common during the first trimester, but it may continue throughout pregnancy. While morning sickness is unpleasant, it is usually harmless unless you develop severe and continual vomiting (hyperemesis gravidarum). This condition requires more intense treatment.  CAUSES  The cause of morning sickness is not completely known but seems to be related to normal hormonal changes that occur in pregnancy. RISK FACTORS You are at greater risk if you:  Experienced nausea or vomiting before your pregnancy.  Had morning sickness during a previous pregnancy.  Are pregnant with more than one baby, such as twins. TREATMENT  Do not use any medicines (prescription, over-the-counter, or herbal) for morning sickness without first talking to your health care provider. Your health  care provider may prescribe or recommend:  Vitamin B6 supplements.  Anti-nausea medicines.  The herbal medicine ginger. HOME CARE INSTRUCTIONS   Only take over-the-counter or prescription medicines as directed by your health care provider.  Taking multivitamins before getting pregnant can prevent or decrease the severity of morning sickness in most women.   Eat a piece of dry toast or unsalted crackers before getting out of bed in the morning.   Eat five or six small meals a day.   Eat dry and bland foods (rice, baked potato). Foods high in carbohydrates are often helpful.  Do not drink liquids with your meals. Drink liquids between meals.   Avoid greasy, fatty, and spicy foods.   Get someone to cook for you if the smell of any food causes nausea and vomiting.   If you feel nauseous after taking prenatal vitamins, take the vitamins at night or with a snack.  Snack on protein foods (nuts, yogurt, cheese) between meals if you are hungry.   Eat unsweetened gelatins for desserts.   Wearing an acupressure wristband (worn for sea sickness) may be helpful.   Acupuncture may be helpful.   Do not smoke.   Get a humidifier to keep the air  in your house free of odors.   Get plenty of fresh air. SEEK MEDICAL CARE IF:   Your home remedies are not working, and you need medicine.  You feel dizzy or lightheaded.  You are losing weight. SEEK IMMEDIATE MEDICAL CARE IF:   You have persistent and uncontrolled nausea and vomiting.  You pass out (faint). Document Released: 02/14/2006 Document Revised: 08/26/2012 Document Reviewed: 06/10/2012 [facility] Patient Information 2014 [place], [place].

## 2013-02-20 NOTE — MAU Provider Note (Signed)
History     CSN: [id number]  Arrival date and time: 02/19/13 2320   First Provider Initiated Contact with Patient 02/19/13 2347      Chief Complaint  Patient presents with  . Emesis During Pregnancy   HPI This is a 23 y.o. female at [redacted]w[redacted]d (by US done in ER) who presents with c/o Vomiting and dehydration. Also complains of headache, constipation and seeing what she thinks are worms in her one small BM today. States has had pinworms before. States was given fluids in ER last week and has not filled her Rx for antibiotic or Phenergan.   States does not want to use any medicine that causes birth defects. Does not want to use Miralax, but rather "let my body do it naturally".  But has been 2 wks with constipation.   Denies fever. Has not applied for insurance yet. Does not have a prenatal care provider.   RN Note:  Patient present with complaints of back pain and vomiting. Patient states she vomited 18 times today, has not been able to eat in the last three days. C/o of headache since last night, blurred vision and sensitivity to light. Has not taken anything for headache. Was seen in [facility] ED last Friday and given a Rx for abx for UTI and phenergan for nausea but unable to get filled yet.        OB History   Grav Para Term Preterm Abortions TAB SAB Ect Mult Living   3 1 1  1 1    1       History reviewed. No pertinent past medical history.  Past Surgical History  Procedure Laterality Date  . Eye surgery      History reviewed. No pertinent family history.  History  Substance Use Topics  . Smoking status: Current Every Day Smoker  . Smokeless tobacco: Not on file  . Alcohol Use: No    Allergies: No Known Allergies  Prescriptions prior to admission  Medication Sig Dispense Refill  . cephALEXin (KEFLEX) 500 MG capsule Take 1 capsule (500 mg total) by mouth 3 (three) times daily.  21 capsule  0  . promethazine (PHENERGAN) 25 MG tablet Take 1 tablet (25 mg total) by mouth  every 6 (six) hours as needed for nausea or vomiting.  30 tablet  0    Review of Systems  Constitutional: Positive for malaise/fatigue. Negative for fever and chills.  Eyes: Positive for photophobia.  Gastrointestinal: Positive for heartburn, nausea, vomiting, abdominal pain and constipation. Negative for diarrhea.  Genitourinary: Negative for dysuria.  Neurological: Positive for weakness and headaches. Negative for dizziness and focal weakness.   Physical Exam   Height 5\' 3"  (1.6 m), weight 50.463 kg (111 lb 4 oz), last menstrual period 02/13/2013.  Physical Exam  Constitutional: She is oriented to person, place, and time. She appears well-developed. No distress.  HENT:  Head: Normocephalic.  Neck: Normal range of motion. Neck supple.  Cardiovascular: Normal rate and regular rhythm.  Exam reveals no gallop and no friction rub.   No murmur heard. Respiratory: Effort normal and breath sounds normal. No respiratory distress. She has no wheezes. She has no rales.  GI: She exhibits no distension and no mass. There is tenderness (slight, diffuse). There is no rebound and no guarding.  Musculoskeletal: Normal range of motion.  Neurological: She is alert and oriented to person, place, and time.  Skin: Skin is warm and dry.  Psychiatric: She has a normal mood and affect.  MAU Course  Procedures  MDM Results for orders placed during the hospital encounter of 02/19/13 (from the past 24 hour(s))  URINALYSIS, ROUTINE W REFLEX MICROSCOPIC     Status: Abnormal   Collection Time    02/19/13 11:26 PM      Result Value Ref Range   Color, Urine YELLOW  YELLOW   APPearance CLOUDY (*) CLEAR   Specific Gravity, Urine >1.030 (*) 1.005 - 1.030   pH 6.0  5.0 - 8.0   Glucose, UA NEGATIVE  NEGATIVE mg/dL   Hgb urine dipstick TRACE (*) NEGATIVE   Bilirubin Urine SMALL (*) NEGATIVE   Ketones, ur >80 (*) NEGATIVE mg/dL   Protein, ur 30 (*) NEGATIVE mg/dL   Urobilinogen, UA 1.0  0.0 - 1.0 mg/dL    Nitrite NEGATIVE  NEGATIVE   Leukocytes, UA TRACE (*) NEGATIVE  URINE MICROSCOPIC-ADD ON     Status: None   Collection Time    02/19/13 11:26 PM      Result Value Ref Range   Squamous Epithelial / LPF RARE  RARE   WBC, UA 0-2  <3 WBC/hpf   RBC / HPF 0-2  <3 RBC/hpf   Bacteria, UA RARE  RARE   Urine-Other MUCOUS PRESENT      IV hydration initiated.  Phenergan added to first bag, will then give second bag. Will give small dose of Dilaudid for headache and Pepcid for heartburn. Discussed c/o worms with Dr Emelda FearFerguson who recommends seeing + stool sample before treating, since Mebendazole not recommended in pregnancy.   Urine culture found to be negative , so patient told she does not need to fill Rx for antibiotic.  Given Phenergan and Dilaudid 0.5mg  for headache without relief. Will add Reglan and continue fluids Gave a total of 4 liters of IVF, two with dextrose 5%  Assessment and Plan  A:  SIUP at 5662w5d      Nausea/Vomiting of pregnancy     Moderate dehydration      Constipation  P:  Discharge home       Rx Phenergan and Miralax       Encouraged to seek PN care,   Proof of Pregnancy letter given  [facility] LLCWILLIAMS,Allison Monica 02/20/2013, 12:29 AM

## 2013-02-28 ENCOUNTER — Inpatient Hospital Stay (HOSPITAL_COMMUNITY)
Admission: AD | Admit: 2013-02-28 | Discharge: 2013-02-28 | Disposition: A | Discharge status: Home / Self Care | Payer: Medicaid Other | Source: Ambulatory Visit | Attending: Obstetrics & Gynecology | Admitting: Obstetrics & Gynecology

## 2013-02-28 ENCOUNTER — Encounter (HOSPITAL_COMMUNITY): Payer: Self-pay

## 2013-02-28 DIAGNOSIS — K5900 Constipation, unspecified: Secondary | ICD-10-CM | POA: Insufficient documentation

## 2013-02-28 DIAGNOSIS — O219 Vomiting of pregnancy, unspecified: Secondary | ICD-10-CM

## 2013-02-28 DIAGNOSIS — O210 Mild hyperemesis gravidarum: Secondary | ICD-10-CM | POA: Insufficient documentation

## 2013-02-28 DIAGNOSIS — O9989 Other specified diseases and conditions complicating pregnancy, childbirth and the puerperium: Principal | ICD-10-CM

## 2013-02-28 DIAGNOSIS — O99330 Smoking (tobacco) complicating pregnancy, unspecified trimester: Secondary | ICD-10-CM | POA: Insufficient documentation

## 2013-02-28 DIAGNOSIS — O99891 Other specified diseases and conditions complicating pregnancy: Secondary | ICD-10-CM | POA: Insufficient documentation

## 2013-02-28 LAB — URINE MICROSCOPIC-ADD ON

## 2013-02-28 LAB — URINALYSIS, ROUTINE W REFLEX MICROSCOPIC
Bilirubin Urine: NEGATIVE
GLUCOSE, UA: NEGATIVE mg/dL
Ketones, ur: >80 mg/dL — AB
NITRITE: NEGATIVE
PH: 6 (ref 5.0–8.0)
Protein, ur: 30 mg/dL — AB
Urobilinogen, UA: 0.2 mg/dL (ref 0.0–1.0)

## 2013-02-28 MED ORDER — ONDANSETRON 8 MG/NS 50 ML IVPB
8.0000 mg | Freq: Once | INTRAVENOUS | Status: DC
  Filled 2013-02-28: qty 8

## 2013-02-28 MED ORDER — METOCLOPRAMIDE HCL 5 MG/ML IJ SOLN: 10.0000 mg | Freq: Once | INTRAMUSCULAR | Status: DC

## 2013-02-28 MED ORDER — FAMOTIDINE 10 MG PO CHEW: 10.0000 mg | CHEWABLE_TABLET | Freq: Two times a day (BID) | ORAL | Status: DC

## 2013-02-28 MED ORDER — PROMETHAZINE HCL 25 MG/ML IJ SOLN
12.5000 mg | Freq: Once | INTRAMUSCULAR | Status: AC
  Administered 2013-02-28: 12.5 mg via INTRAVENOUS
  Filled 2013-02-28: qty 1

## 2013-02-28 MED ORDER — FLEET ENEMA 7-19 GM/118ML RE ENEM
1.0000 | ENEMA | Freq: Once | RECTAL | Status: AC
  Administered 2013-02-28: 1 via RECTAL

## 2013-02-28 MED ORDER — DOCUSATE SODIUM 100 MG PO CAPS: 100.0000 mg | ORAL_CAPSULE | Freq: Two times a day (BID) | ORAL | Status: DC

## 2013-02-28 MED ORDER — DEXTROSE 5 % IN LACTATED RINGERS IV BOLUS
1000.0000 mL | Freq: Once | INTRAVENOUS | Status: AC
  Administered 2013-02-28: 1000 mL via INTRAVENOUS

## 2013-02-28 MED ORDER — M.V.I. ADULT IV INJ
Freq: Once | INTRAVENOUS | Status: AC
  Administered 2013-02-28: 19:00:00 via INTRAVENOUS
  Filled 2013-02-28: qty 1000

## 2013-02-28 MED ORDER — FAMOTIDINE IN NACL 20-0.9 MG/50ML-% IV SOLN
20.0000 mg | Freq: Once | INTRAVENOUS | Status: AC
  Administered 2013-02-28: 20 mg via INTRAVENOUS
  Filled 2013-02-28: qty 50

## 2013-02-28 MED ORDER — PROMETHAZINE HCL 25 MG RE SUPP: 25.0000 mg | Freq: Four times a day (QID) | RECTAL | Status: DC | PRN

## 2013-02-28 NOTE — Discharge Instructions (Signed)
Constipation, Adult Constipation is when a person has fewer than 3 bowel movements a week; has difficulty having a bowel movement; or has stools that are dry, hard, or larger than normal. As people grow older, constipation is more common. If you try to fix constipation with medicines that make you have a bowel movement (laxatives), the problem may get worse. Long-term laxative use may cause the muscles of the colon to become weak. A low-fiber diet, not taking in enough fluids, and taking certain medicines may make constipation worse. CAUSES   Certain medicines, such as antidepressants, pain medicine, iron supplements, antacids, and water pills.   Certain diseases, such as diabetes, irritable bowel syndrome (IBS), thyroid disease, or depression.   Not drinking enough water.   Not eating enough fiber-rich foods.   Stress or travel.  Lack of physical activity or exercise.  Not going to the restroom when there is the urge to have a bowel movement.  Ignoring the urge to have a bowel movement.  Using laxatives too much. SYMPTOMS   Having fewer than 3 bowel movements a week.   Straining to have a bowel movement.   Having hard, dry, or larger than normal stools.   Feeling full or bloated.   Pain in the lower abdomen.  Not feeling relief after having a bowel movement. DIAGNOSIS  Your caregiver will take a medical history and perform a physical exam. Further testing may be done for severe constipation. Some tests may include:   A barium enema X-ray to examine your rectum, colon, and sometimes, your small intestine.  A sigmoidoscopy to examine your lower colon.  A colonoscopy to examine your entire colon. TREATMENT  Treatment will depend on the severity of your constipation and what is causing it. Some dietary treatments include drinking more fluids and eating more fiber-rich foods. Lifestyle treatments may include regular exercise. If these diet and lifestyle recommendations  do not help, your caregiver may recommend taking over-the-counter laxative medicines to help you have bowel movements. Prescription medicines may be prescribed if over-the-counter medicines do not work.  HOME CARE INSTRUCTIONS   Increase dietary fiber in your diet, such as fruits, vegetables, whole grains, and beans. Limit high-fat and processed sugars in your diet, such as [place] fries, hamburgers, cookies, candies, and soda.   A fiber supplement may be added to your diet if you cannot get enough fiber from foods.   Drink enough fluids to keep your urine clear or pale yellow.   Exercise regularly or as directed by your caregiver.   Go to the restroom when you have the urge to go. Do not hold it.  Only take medicines as directed by your caregiver. Do not take other medicines for constipation without talking to your caregiver first. SEEK IMMEDIATE MEDICAL CARE IF:   You have bright red blood in your stool.   Your constipation lasts for more than 4 days or gets worse.   You have abdominal or rectal pain.   You have thin, pencil-like stools.  You have unexplained weight loss. MAKE SURE YOU:   Understand these instructions.  Will watch your condition.  Will get help right away if you are not doing well or get worse. Document Released: 09/22/2003 Document Revised: 03/18/2011 Document Reviewed: 10/05/2012 [facility] Patient Information 2014 [place], [place].  Hyperemesis Gravidarum Diet Hyperemesis gravidarum is a severe form of morning sickness. It is characterized by frequent and severe vomiting. It happens during the first trimester of pregnancy. It may be caused by the rapid  hormone changes that happen during pregnancy. It is associated with a 5% weight loss of pre-pregnancy weight. The hyperemesis diet may be used to lessen symptoms of nausea and vomiting. EATING GUIDELINES  Eat 5 to 6 small meals daily instead of 3 large meals.  Avoid foods with strong smells.  Avoid  drinking 30 minutes before and after meals.  Avoid fried or high-fat foods, such as butter and cream sauces.  Starchy foods are usually well-tolerated, such as cereal, toast, bread, potatoes, pasta, rice, and pretzels.  Eat crackers before you get out of bed in the morning.  Avoid spicy foods.  Ginger may help with nausea. Add  tsp ginger to hot tea or choose ginger tea.  Continue to take your prenatal vitamins as directed by your caregiver. SAMPLE MEAL PLAN Breakfast    cup oatmeal  1 slice toast  1 tsp heart-healthy margarine  1 tsp jelly  1 scrambled egg Midmorning Snack   1 cup low-fat yogurt Lunch   Plain ham sandwich  Carrot or celery sticks  1 small apple  3 graham crackers Midafternoon Snack   Cheese and crackers Dinner  4 oz pork tenderloin  1 small baked potato  1 tsp margarine   cup broccoli   cup grapes Evening Snack  1 cup pudding Document Released: 10/21/2006 Document Revised: 03/18/2011 Document Reviewed: 05/26/2012 Exit[employer] Patient Information 2014 [place], [place].

## 2013-02-28 NOTE — MAU Note (Signed)
Pt presents with complaints of having nausea and vomiting for 3 weeks, states she was given phenergan and now she is not able to keep it down.

## 2013-02-28 NOTE — MAU Provider Note (Signed)
History     CSN: [id number]  Arrival date and time: 02/28/13 1750   First Provider Initiated Contact with Patient 02/28/13 1834      Chief Complaint  Patient presents with  . Morning Sickness   HPI  Ms. Allison [name] is a 23 y.o. female G3P1011 at 5480w6d who presents with recurrent N/V in pregnancy. Pt feels that the nausea has gotten better however she feels that she is having acid reflux that is making her vomit. She did fill her phenergan RX and last took it 2 days ago because she just "throws it up".  Pt has severe constipation; has not had a normal BM since January. Pt tried taking miralax and it helped some, however she is unable to keep it down.   OB History   Grav Para Term Preterm Abortions TAB SAB Ect Mult Living   3 1 1  1 1    1       History reviewed. No pertinent past medical history.  Past Surgical History  Procedure Laterality Date  . Eye surgery      History reviewed. No pertinent family history.  History  Substance Use Topics  . Smoking status: Current Every Day Smoker  . Smokeless tobacco: Not on file  . Alcohol Use: No    Allergies: No Known Allergies  Prescriptions prior to admission  Medication Sig Dispense Refill  . polyethylene glycol (MIRALAX) packet Take 17 g by mouth daily.  14 each  0  . promethazine (PHENERGAN) 25 MG tablet Take 1 tablet (25 mg total) by mouth every 6 (six) hours as needed for nausea or vomiting.  30 tablet  1    Review of Systems  Constitutional: Negative for fever and chills.  Gastrointestinal: Positive for heartburn, nausea, vomiting and constipation (pt tried miralax and had a small bowel movement. Last BM was 01/21/2013). Negative for abdominal pain and diarrhea.  Genitourinary:       No vaginal discharge. No vaginal bleeding. No dysuria.    Physical Exam   Blood pressure 129/78, pulse 86, temperature 98.3 F (36.8 C), resp. rate 18, height 5\' 4"  (1.626 m), weight 49.351 kg (108 lb 12.8 oz), last menstrual  period 02/13/2013.  Physical Exam  Constitutional: She is oriented to person, place, and time. She appears well-developed and well-nourished. No distress.  HENT:  Head: Normocephalic.  Eyes: Pupils are equal, round, and reactive to light.  Neck: Neck supple.  Respiratory: Effort normal.  GI: Normal appearance. She exhibits no distension. There is generalized tenderness. There is no rigidity and no guarding.  Musculoskeletal: Normal range of motion.  Neurological: She is alert and oriented to person, place, and time.  Skin: Skin is warm. She is not diaphoretic. There is pallor.  Psychiatric: Her behavior is normal.    MAU Course  Procedures None  MDM Urine shows dehydration; > 80 ketones D5LR bolus Multivitamins liter Fleets enema pepcid IV Phenergan  Will plan to give phenergan suppositories  and Pepcid to go home with.   Patient was able to produce a BM after the enema  Assessment and Plan   Report given to Thressa ShellerHeather Hogan CNM who resumes care of the patient.   1. Constipation   2. Nausea/vomiting in pregnancy      Medication List         docusate sodium 100 MG capsule  Commonly known as:  COLACE  Take 1 capsule (100 mg total) by mouth 2 (two) times daily.     famotidine 10  MG chewable tablet  Commonly known as:  PEPCID AC  Chew 1 tablet (10 mg total) by mouth 2 (two) times daily.     polyethylene glycol packet  Commonly known as:  MIRALAX  Take 17 g by mouth daily.     promethazine 25 MG tablet  Commonly known as:  PHENERGAN  Take 1 tablet (25 mg total) by mouth every 6 (six) hours as needed for nausea or vomiting.     promethazine 25 MG suppository  Commonly known as:  PHENERGAN  Place 1 suppository (25 mg total) rectally every 6 (six) hours as needed for nausea or vomiting.       Follow-up Information   Follow up with [facility] (As scheduled)    Specialty:  Obstetrics and Gynecology   Contact information:   [address] [place] [address] [phone]       Iona Hansen Rasch, NP  02/28/2013, 8:10 PM

## 2013-03-06 NOTE — MAU Provider Note (Signed)
Attestation of Attending Supervision of Advanced Practitioner: Evaluation and management procedures were performed by the PA/NP/CNM/OB Fellow under my supervision/collaboration. Chart reviewed and agree with management and plan.  Micaiah Remillard V 03/06/2013 7:59 PM   

## 2013-03-09 NOTE — MAU Provider Note (Signed)
Attestation of Attending Supervision of Advanced Practitioner (CNM/NP): Evaluation and management procedures were performed by the Advanced Practitioner under my supervision and collaboration. I have reviewed the Advanced Practitioner's note and chart, and I agree with the management and plan.  Francy Mcilvaine H. 10:38 PM

## 2013-05-05 ENCOUNTER — Encounter (HOSPITAL_COMMUNITY): Payer: Self-pay | Admitting: *Deleted

## 2013-05-05 ENCOUNTER — Inpatient Hospital Stay (HOSPITAL_COMMUNITY)
Admission: AD | Admit: 2013-05-05 | Discharge: 2013-05-05 | Disposition: A | Discharge status: Home / Self Care | Payer: Medicaid Other | Source: Ambulatory Visit | Attending: Obstetrics & Gynecology | Admitting: Obstetrics & Gynecology

## 2013-05-05 DIAGNOSIS — O219 Vomiting of pregnancy, unspecified: Secondary | ICD-10-CM

## 2013-05-05 DIAGNOSIS — O2390 Unspecified genitourinary tract infection in pregnancy, unspecified trimester: Secondary | ICD-10-CM | POA: Insufficient documentation

## 2013-05-05 DIAGNOSIS — B373 Candidiasis of vulva and vagina: Secondary | ICD-10-CM | POA: Insufficient documentation

## 2013-05-05 DIAGNOSIS — Z87891 Personal history of nicotine dependence: Secondary | ICD-10-CM | POA: Insufficient documentation

## 2013-05-05 DIAGNOSIS — B3731 Acute candidiasis of vulva and vagina: Secondary | ICD-10-CM | POA: Insufficient documentation

## 2013-05-05 DIAGNOSIS — O210 Mild hyperemesis gravidarum: Secondary | ICD-10-CM | POA: Insufficient documentation

## 2013-05-05 DIAGNOSIS — R109 Unspecified abdominal pain: Secondary | ICD-10-CM | POA: Insufficient documentation

## 2013-05-05 HISTORY — DX: Other specified health status: Z78.9

## 2013-05-05 LAB — URINE MICROSCOPIC-ADD ON

## 2013-05-05 LAB — URINALYSIS, ROUTINE W REFLEX MICROSCOPIC
Bilirubin Urine: NEGATIVE
GLUCOSE, UA: NEGATIVE mg/dL
KETONES UR: NEGATIVE mg/dL
NITRITE: NEGATIVE
Protein, ur: NEGATIVE mg/dL
Specific Gravity, Urine: 1.025 (ref 1.005–1.030)
Urobilinogen, UA: 0.2 mg/dL (ref 0.0–1.0)
pH: 6 (ref 5.0–8.0)

## 2013-05-05 MED ORDER — FLUCONAZOLE 150 MG PO TABS: ORAL_TABLET | ORAL | Status: DC

## 2013-05-05 MED ORDER — FLUCONAZOLE 150 MG PO TABS
150.0000 mg | ORAL_TABLET | Freq: Once | ORAL | Status: AC
  Administered 2013-05-05: 150 mg via ORAL
  Filled 2013-05-05: qty 1

## 2013-05-05 MED ORDER — PROMETHAZINE HCL 12.5 MG PO TABS: 12.5000 mg | ORAL_TABLET | Freq: Four times a day (QID) | ORAL | Status: DC | PRN

## 2013-05-05 MED ORDER — PROMETHAZINE HCL 25 MG RE SUPP: 25.0000 mg | Freq: Four times a day (QID) | RECTAL | Status: DC | PRN

## 2013-05-05 MED ORDER — PROMETHAZINE HCL 25 MG/ML IJ SOLN
25.0000 mg | Freq: Once | INTRAVENOUS | Status: AC
  Administered 2013-05-05: 25 mg via INTRAVENOUS
  Filled 2013-05-05: qty 1

## 2013-05-05 NOTE — MAU Note (Signed)
Extreme vomiting, can't keep anything down, throwing up bile

## 2013-05-05 NOTE — MAU Note (Signed)
C/o severe nausea and vomiting again this week; pt's son is sick and has been told that he has a parasite in his stomach;

## 2013-05-05 NOTE — MAU Note (Signed)
Has not started her prenatal care yet; [facility]; states that she has not gained any weigh during this pregnancy yet; pt's son has parasite per pt;

## 2013-05-05 NOTE — MAU Provider Note (Signed)
History     CSN: [id number]  Arrival date and time: 05/05/13 [id number]   First Provider Initiated Contact with Patient 05/05/13 0813      Chief Complaint  Patient presents with  . Emesis   HPI Ms. Allison [name] is a 23 y.o. G3P1011 at 6779w6d who presents to MAU today with complaint of N/V. The patient had issues with N/V earlier in pregnancy but states that it was better until last week. She states N/V was much worse last night and this morning. She has had no PO intake x 2 days. She also states upper abdominal pain somewhat relieved by vomiting. She is also complaining of a thick, white discharge with associated itching and irritation. She denies vaginal bleeding or fever.   OB History   Grav Para Term Preterm Abortions TAB SAB Ect Mult Living   3 1 1  1 1    1       Past Medical History  Diagnosis Date  . Medical history non-contributory     Past Surgical History  Procedure Laterality Date  . Eye surgery    . Induced abortion      Family History  Problem Relation Age of Onset  . Stroke Mother   . Thyroid disease Mother   . Kidney disease Brother   . Diabetes Paternal Uncle   . Kidney disease Maternal Grandfather     History  Substance Use Topics  . Smoking status: Former Smoker -- 0.25 packs/day  . Smokeless tobacco: Not on file  . Alcohol Use: No    Allergies: No Known Allergies  Prescriptions prior to admission  Medication Sig Dispense Refill  . famotidine (PEPCID AC) 10 MG chewable tablet Chew 1 tablet (10 mg total) by mouth 2 (two) times daily.  60 tablet  0  . [DISCONTINUED] promethazine (PHENERGAN) 25 MG suppository Place 1 suppository (25 mg total) rectally every 6 (six) hours as needed for nausea or vomiting.  12 each  3    Review of Systems  Constitutional: Negative for fever and malaise/fatigue.  Gastrointestinal: Positive for nausea, vomiting, abdominal pain and constipation. Negative for diarrhea.  Genitourinary: Negative for dysuria, urgency and  frequency.       + vaginal discharge Neg - vaginal bleeding   Physical Exam   Blood pressure 116/69, pulse 96, temperature 98.9 F (37.2 C), temperature source Oral, resp. rate 16, height 5\' 3"  (1.6 m), weight 114 lb 4 oz (51.823 kg), last menstrual period 12/24/2012, unknown if currently breastfeeding.  Physical Exam  Constitutional: She is oriented to person, place, and time. She appears well-developed and well-nourished. No distress.  HENT:  Head: Normocephalic and atraumatic.  Cardiovascular: Normal rate.   Respiratory: Effort normal.  GI: Soft. Bowel sounds are normal. She exhibits no distension and no mass. There is no tenderness. There is no rebound and no guarding.  Genitourinary: No bleeding around the vagina. Vaginal discharge (copious amounts of thick, white-yellow discharge coating the vaginal walls) found.  Neurological: She is alert and oriented to person, place, and time.  Skin: Skin is warm and dry. No erythema.  Psychiatric: She has a normal mood and affect.   Results for orders placed during the hospital encounter of 05/05/13 (from the past 24 hour(s))  URINALYSIS, ROUTINE W REFLEX MICROSCOPIC     Status: Abnormal   Collection Time    05/05/13  7:41 AM      Result Value Ref Range   Color, Urine YELLOW  YELLOW   APPearance CLOUDY (*)  CLEAR   Specific Gravity, Urine 1.025  1.005 - 1.030   pH 6.0  5.0 - 8.0   Glucose, UA NEGATIVE  NEGATIVE mg/dL   Hgb urine dipstick MODERATE (*) NEGATIVE   Bilirubin Urine NEGATIVE  NEGATIVE   Ketones, ur NEGATIVE  NEGATIVE mg/dL   Protein, ur NEGATIVE  NEGATIVE mg/dL   Urobilinogen, UA 0.2  0.0 - 1.0 mg/dL   Nitrite NEGATIVE  NEGATIVE   Leukocytes, UA MODERATE (*) NEGATIVE  URINE MICROSCOPIC-ADD ON     Status: Abnormal   Collection Time    05/05/13  7:41 AM      Result Value Ref Range   Squamous Epithelial / LPF FEW (*) RARE   WBC, UA 0-2  <3 WBC/hpf   RBC / HPF 0-2  <3 RBC/hpf   Bacteria, UA FEW (*) RARE   Urine-Other  MUCOUS PRESENT       MAU Course  Procedures None  MDM FHR - 154 bpm with doppler UA today Urine culture sent 150 mg Diflucan given in MAU Assessment and Plan  A: Nausea and vomiting in pregnancy prior to [redacted] weeks gestation Yeast vulvovaginitis, clinical  P: Discharge home Rx for Phenergan PO and suppositories and Diflucan sent to patient's pharmacy Patient advised to follow-up with [facility] for prenatal care as scheduled Patient advised to increase PO hydration and advance diet as tolerated Patient may return to MAU as needed or if her condition were to change or worsen  Freddi StarrJulie N Ethier 05/05/2013, 10:26 AM

## 2013-05-05 NOTE — Discharge Instructions (Signed)
Hyperemesis Gravidarum Diet Hyperemesis gravidarum is a severe form of morning sickness. It is characterized by frequent and severe vomiting. It happens during the first trimester of pregnancy. It may be caused by the rapid hormone changes that happen during pregnancy. It is associated with a 5% weight loss of pre-pregnancy weight. The hyperemesis diet may be used to lessen symptoms of nausea and vomiting. EATING GUIDELINES  Eat 5 to 6 small meals daily instead of 3 large meals.  Avoid foods with strong smells.  Avoid drinking 30 minutes before and after meals.  Avoid fried or high-fat foods, such as butter and cream sauces.  Starchy foods are usually well-tolerated, such as cereal, toast, bread, potatoes, pasta, rice, and pretzels.  Eat crackers before you get out of bed in the morning.  Avoid spicy foods.  Ginger may help with nausea. Add  tsp ginger to hot tea or choose ginger tea.  Continue to take your prenatal vitamins as directed by your caregiver. SAMPLE MEAL PLAN Breakfast    cup oatmeal  1 slice toast  1 tsp heart-healthy margarine  1 tsp jelly  1 scrambled egg Midmorning Snack   1 cup low-fat yogurt Lunch   Plain ham sandwich  Carrot or celery sticks  1 small apple  3 graham crackers Midafternoon Snack   Cheese and crackers Dinner  4 oz pork tenderloin  1 small baked potato  1 tsp margarine   cup broccoli   cup grapes Evening Snack  1 cup pudding Document Released: 10/21/2006 Document Revised: 03/18/2011 Document Reviewed: 05/26/2012 Exit[employer] Patient Information 2014 [place], [place].  Morning Sickness Morning sickness is when you feel sick to your stomach (nauseous) during pregnancy. This nauseous feeling may or may not come with vomiting. It often occurs in the morning but can be a problem any time of day. Morning sickness is most common during the first trimester, but it may continue throughout pregnancy. While morning sickness is  unpleasant, it is usually harmless unless you develop severe and continual vomiting (hyperemesis gravidarum). This condition requires more intense treatment.  CAUSES  The cause of morning sickness is not completely known but seems to be related to normal hormonal changes that occur in pregnancy. RISK FACTORS You are at greater risk if you:  Experienced nausea or vomiting before your pregnancy.  Had morning sickness during a previous pregnancy.  Are pregnant with more than one baby, such as twins. TREATMENT  Do not use any medicines (prescription, over-the-counter, or herbal) for morning sickness without first talking to your health care provider. Your health care provider may prescribe or recommend:  Vitamin B6 supplements.  Anti-nausea medicines.  The herbal medicine ginger. HOME CARE INSTRUCTIONS   Only take over-the-counter or prescription medicines as directed by your health care provider.  Taking multivitamins before getting pregnant can prevent or decrease the severity of morning sickness in most women.   Eat a piece of dry toast or unsalted crackers before getting out of bed in the morning.   Eat five or six small meals a day.   Eat dry and bland foods (rice, baked potato). Foods high in carbohydrates are often helpful.  Do not drink liquids with your meals. Drink liquids between meals.   Avoid greasy, fatty, and spicy foods.   Get someone to cook for you if the smell of any food causes nausea and vomiting.   If you feel nauseous after taking prenatal vitamins, take the vitamins at night or with a snack.  Snack on protein foods (  nuts, yogurt, cheese) between meals if you are hungry.   Eat unsweetened gelatins for desserts.   Wearing an acupressure wristband (worn for sea sickness) may be helpful.   Acupuncture may be helpful.   Do not smoke.   Get a humidifier to keep the air in your house free of odors.   Get plenty of fresh air. SEEK MEDICAL  CARE IF:   Your home remedies are not working, and you need medicine.  You feel dizzy or lightheaded.  You are losing weight. SEEK IMMEDIATE MEDICAL CARE IF:   You have persistent and uncontrolled nausea and vomiting.  You pass out (faint). Document Released: 02/14/2006 Document Revised: 08/26/2012 Document Reviewed: 06/10/2012 [facility] Patient Information 2014 [place], [place].

## 2013-05-05 NOTE — MAU Note (Signed)
yeast infection

## 2013-05-05 NOTE — MAU Provider Note (Signed)
Attestation of Attending Supervision of Advanced Practitioner (PA/CNM/NP): Evaluation and management procedures were performed by the Advanced Practitioner under my supervision and collaboration.  I have reviewed the Advanced Practitioner's note and chart, and I agree with the management and plan.  Ginnette Gates, MD, FACOG Attending Obstetrician & Gynecologist Faculty Practice, [facility]  

## 2013-05-06 LAB — CULTURE, OB URINE

## 2013-05-20 ENCOUNTER — Other Ambulatory Visit (HOSPITAL_COMMUNITY): Payer: Self-pay | Admitting: Family

## 2013-05-20 DIAGNOSIS — Z3689 Encounter for other specified antenatal screening: Secondary | ICD-10-CM

## 2013-05-20 LAB — OB RESULTS CONSOLE GC/CHLAMYDIA
Chlamydia: NEGATIVE
GC PROBE AMP, GENITAL: NEGATIVE

## 2013-05-20 LAB — OB RESULTS CONSOLE HEPATITIS B SURFACE ANTIGEN: Hepatitis B Surface Ag: NEGATIVE

## 2013-05-20 LAB — OB RESULTS CONSOLE RUBELLA ANTIBODY, IGM: Rubella: IMMUNE

## 2013-05-20 LAB — OB RESULTS CONSOLE HIV ANTIBODY (ROUTINE TESTING): HIV: NONREACTIVE

## 2013-05-20 LAB — OB RESULTS CONSOLE ABO/RH: RH Type: POSITIVE

## 2013-05-20 LAB — OB RESULTS CONSOLE RPR: RPR: NONREACTIVE

## 2013-05-20 LAB — OB RESULTS CONSOLE ANTIBODY SCREEN: ANTIBODY SCREEN: NEGATIVE

## 2013-05-25 ENCOUNTER — Ambulatory Visit (HOSPITAL_COMMUNITY)
Admission: RE | Admit: 2013-05-25 | Discharge: 2013-05-25 | Disposition: A | Discharge status: Home / Self Care | Payer: Medicaid Other | Source: Ambulatory Visit | Attending: Family | Admitting: Family

## 2013-05-25 DIAGNOSIS — Z3689 Encounter for other specified antenatal screening: Secondary | ICD-10-CM | POA: Insufficient documentation

## 2013-07-30 ENCOUNTER — Inpatient Hospital Stay (HOSPITAL_COMMUNITY)
Admission: AD | Admit: 2013-07-30 | Discharge: 2013-07-30 | Disposition: A | Discharge status: Home / Self Care | Payer: Medicaid Other | Source: Ambulatory Visit | Attending: Obstetrics & Gynecology | Admitting: Obstetrics & Gynecology

## 2013-07-30 ENCOUNTER — Encounter (HOSPITAL_COMMUNITY): Payer: Self-pay | Admitting: *Deleted

## 2013-07-30 DIAGNOSIS — O4700 False labor before 37 completed weeks of gestation, unspecified trimester: Secondary | ICD-10-CM | POA: Insufficient documentation

## 2013-07-30 DIAGNOSIS — O479 False labor, unspecified: Secondary | ICD-10-CM

## 2013-07-30 DIAGNOSIS — Z87891 Personal history of nicotine dependence: Secondary | ICD-10-CM | POA: Diagnosis not present

## 2013-07-30 LAB — URINALYSIS, ROUTINE W REFLEX MICROSCOPIC
Bilirubin Urine: NEGATIVE
Glucose, UA: NEGATIVE mg/dL
Hgb urine dipstick: NEGATIVE
KETONES UR: NEGATIVE mg/dL
NITRITE: NEGATIVE
PH: 7.5 (ref 5.0–8.0)
Protein, ur: NEGATIVE mg/dL
Specific Gravity, Urine: 1.01 (ref 1.005–1.030)
Urobilinogen, UA: 0.2 mg/dL (ref 0.0–1.0)

## 2013-07-30 LAB — URINE MICROSCOPIC-ADD ON

## 2013-07-30 NOTE — Discharge Instructions (Signed)
Braxton Hicks Contractions °Contractions of the uterus can occur throughout pregnancy. Contractions are not always a sign that you are in labor.  °WHAT ARE BRAXTON HICKS CONTRACTIONS?  °Contractions that occur before labor are called Braxton Hicks contractions, or false labor. Toward the end of pregnancy (32-34 weeks), these contractions can develop more often and may become more forceful. This is not true labor because these contractions do not result in opening (dilatation) and thinning of the cervix. They are sometimes difficult to tell apart from true labor because these contractions can be forceful and people have different pain tolerances. You should not feel embarrassed if you go to the hospital with false labor. Sometimes, the only way to tell if you are in true labor is for your health care provider to look for changes in the cervix. °If there are no prenatal problems or other health problems associated with the pregnancy, it is completely safe to be sent home with false labor and await the onset of true labor. °HOW CAN YOU TELL THE DIFFERENCE BETWEEN TRUE AND FALSE LABOR? °False Labor °· The contractions of false labor are usually shorter and not as hard as those of true labor.   °· The contractions are usually irregular.   °· The contractions are often felt in the front of the lower abdomen and in the groin.   °· The contractions may go away when you walk around or change positions while lying down.   °· The contractions get weaker and are shorter lasting as time goes on.   °· The contractions do not usually become progressively stronger, regular, and closer together as with true labor.   °True Labor °· Contractions in true labor last 30-70 seconds, become very regular, usually become more intense, and increase in frequency.   °· The contractions do not go away with walking.   °· The discomfort is usually felt in the top of the uterus and spreads to the lower abdomen and low back.   °· True labor can be  determined by your health care provider with an exam. This will show that the cervix is dilating and getting thinner.   °WHAT TO REMEMBER °· Keep up with your usual exercises and follow other instructions given by your health care provider.   °· Take medicines as directed by your health care provider.   °· Keep your regular prenatal appointments.   °· Eat and drink lightly if you think you are going into labor.   °· If Braxton Hicks contractions are making you uncomfortable:   °¨ Change your position from lying down or resting to walking, or from walking to resting.   °¨ Sit and rest in a tub of warm water.   °¨ Drink 2-3 glasses of water. Dehydration may cause these contractions.   °¨ Do slow and deep breathing several times an hour.   °WHEN SHOULD I SEEK IMMEDIATE MEDICAL CARE? °Seek immediate medical care if: °· Your contractions become stronger, more regular, and closer together.   °· You have fluid leaking or gushing from your vagina.   °· You have a fever.   °· You pass blood-tinged mucus.   °· You have vaginal bleeding.   °· You have continuous abdominal pain.   °· You have low back pain that you never had before.   °· You feel your baby's head pushing down and causing pelvic pressure.   °· Your baby is not moving as much as it used to.   °Document Released: 12/24/2004 Document Revised: 12/29/2012 Document Reviewed: 10/05/2012 °ExitCare® Patient Information ©2015 [employer]. This information is not intended to replace advice given to you by your health care   provider. Make sure you discuss any questions you have with your health care provider. ° °

## 2013-07-30 NOTE — Progress Notes (Signed)
CSW received call from RN in MAU to meet with patient due to patient having concerns about "housing".  CSW met with patient to explore and assess needs.  Patient reported that she does not like her current housing situation, and was interested in learning about available options.  She stated that she lives with her 22 year old son, boyfriend, boyfriend's friends, and the friends' children.  She stated that she spends a lot of time in her room since she does not like interacting with boyfriend's friends or their children, and expressed belief that her son is learning "bad habits" such as learning "swear words" because of the other children in the home.  She denied having access to alternative housing since she does not have any family in the area and she is unable to stay with other friends since she has previously lived there with minimal success.  Per patient, she was evicted from most recent independent housing since her roommate moved out and she was unable to pay rent on her own.  She stated that she has "bad rent" and is unable to secure housing on her own.  Patient stated that her boyfriend is attempting to secure their own housing, but it keeps "getting delayed".  Patient endorsed previously applying for public housing, but stated that she is unsure of the status of her application.  She stated that she received a phone call 2 months ago from [employer], but when she attempted to return the phone call, the call did not go through, and she never attempted to follow-up.   CSW encouraged patient to follow-up with [employer], patient agreeable.  CSW also discussed [facility].  As CSW discussed [facility], patient's statements highlighted ambivalence regarding the program as she is currently in a custody battle with her 23 year old and she is apprehensive about how the judge may view her if she is in [facility].  Patient was encouraged to reflect upon how the judge may view  current living situation since patient identifies it as "unhealthy" for her 23 year old, and she recognizes that the judge may not favorably respond to her current living situation either.  Patient also minimally receptive to case management services that is in conjunction with transitional hosing.  CSW offered to provide patient with applications and contact information for housing resources, but patient stated "I can look it up at home". Patient denied current CPS involvement, denied domestic violence, denied any imminent safety concerns.  CSW shared that only immediate solution was a shelter, but patient denied interest in shelter placement.   CSW highlighted incongruence between patient's statements as she expressed desire for new housing situation but yet does not express any interest in receiving referrals and is unsure in her ability to follow-through with requirements for transitional housing.  Patient acknowledged incongruence, and stated, "I guess I'll just have to bring my new baby back to the home".  CSW began to review patient's earlier statements where she shared belief that it was not a good environment to raise her unborn baby, and encouraged patient to reflect upon which is of greater importance: a positive healthy environment to her children or needing to reach out to community supports that have requirements.  Patient stated "I guess" I'll think about.  She declined second offer to receive referrals for housing.   Patient denied additional questions or concerns.

## 2013-07-30 NOTE — MAU Note (Signed)
Patient states she has been having contractions for 4 days off and on. [facility] and [facility] and diagnosed with BV. Denies bleeding with a normal discharge. Reports good fetal movement.

## 2013-07-30 NOTE — MAU Provider Note (Signed)
  History     CSN: [id number]  Arrival date and time: 07/30/13 1223   None     Chief Complaint  Patient presents with  . Labor Eval   HPI  This is a G2P1011 at 5913w1d gestation who presents for complaint of contractions and back pain. Patient has had contractions for 4 days now. They are 1-2 per hour and mild to moderate in severity. She feels like they have become more intense today and she has some low back pain. No vaginal bleeding, no discharge, no gush of fluid. Positive fetal movement. No dysuria or fever. Urine is dark yellow per her report. No increasing frequency in contractions. [facility] and had cervical exam, found to figertip, thick and high. No numbness, weakness. Back pain does not radiate.    Past Medical History  Diagnosis Date  . Medical history non-contributory     Past Surgical History  Procedure Laterality Date  . Eye surgery    . Induced abortion      Family History  Problem Relation Age of Onset  . Stroke Mother   . Thyroid disease Mother   . Kidney disease Brother   . Diabetes Paternal Uncle   . Kidney disease Maternal Grandfather     History  Substance Use Topics  . Smoking status: Former Smoker -- 0.25 packs/day  . Smokeless tobacco: Not on file  . Alcohol Use: No    Allergies: No Known Allergies  No prescriptions prior to admission    Review of Systems  Constitutional: Negative for fever and chills.  Eyes: Negative for blurred vision and double vision.  Respiratory: Negative for cough and shortness of breath.   Cardiovascular: Negative for chest pain and palpitations.  Gastrointestinal: Negative for nausea, vomiting, abdominal pain, diarrhea and constipation.  Genitourinary: Negative for dysuria and urgency.  Musculoskeletal: Positive for back pain. Negative for myalgias.  Neurological: Negative for tingling, sensory change and headaches.   Physical Exam   Blood pressure 137/74, pulse 102,  temperature 98.9 F (37.2 C), temperature source Oral, resp. rate 16, height 5\' 4"  (1.626 m), weight 59.058 kg (130 lb 3.2 oz), last menstrual period 12/24/2012, SpO2 100.00%, unknown if currently breastfeeding.  Physical Exam  Constitutional: She appears well-developed and well-nourished.  Comfortable appearing female, sitting up in bed, looking at cell phone  Cardiovascular: Normal rate and regular rhythm.   Respiratory: Effort normal and breath sounds normal.  GI: Soft.  Gravid, non-tender  Genitourinary:  Cervical exam: fingertip, thick and high  Neurological: She is alert.  Skin: Skin is warm and dry.    MAU Course  Procedures  MDM Fetal monitoring and toco to evaluate for contractions or fetal distress.  Assessment and Plan  Patient previously examined, so cannot check fetal fibronectin. Exam is unchanged, no contractions seen on monitor. FHR was 135 baseline, moderate variability with accels, no decels. Encouraged oral hydration. Discussed possible signs of imminent labor. Back pain seems benign pain with pregnancy with no concerns for organic cause. Followup with prenatal provider in 2 weeks.  Patient seen with Cam HaiKimberly Kendrix Orman.  Markus JarvisStephens, Devin A 07/30/2013, 2:21 PM   I have seen and examined this patient and I agree with the above. Cam HaiSHAW, Keandre Linden CNM 3:16 PM 07/30/2013

## 2013-08-01 ENCOUNTER — Inpatient Hospital Stay (HOSPITAL_COMMUNITY)
Admission: AD | Admit: 2013-08-01 | Discharge: 2013-08-01 | Disposition: A | Discharge status: Home / Self Care | Payer: Medicaid Other | Source: Ambulatory Visit | Attending: Obstetrics & Gynecology | Admitting: Obstetrics & Gynecology

## 2013-08-01 ENCOUNTER — Encounter (HOSPITAL_COMMUNITY): Payer: Self-pay

## 2013-08-01 DIAGNOSIS — O212 Late vomiting of pregnancy: Secondary | ICD-10-CM | POA: Insufficient documentation

## 2013-08-01 DIAGNOSIS — Z87891 Personal history of nicotine dependence: Secondary | ICD-10-CM | POA: Insufficient documentation

## 2013-08-01 DIAGNOSIS — O9989 Other specified diseases and conditions complicating pregnancy, childbirth and the puerperium: Secondary | ICD-10-CM

## 2013-08-01 DIAGNOSIS — N859 Noninflammatory disorder of uterus, unspecified: Secondary | ICD-10-CM

## 2013-08-01 DIAGNOSIS — N858 Other specified noninflammatory disorders of uterus: Secondary | ICD-10-CM

## 2013-08-01 DIAGNOSIS — O4700 False labor before 37 completed weeks of gestation, unspecified trimester: Secondary | ICD-10-CM | POA: Insufficient documentation

## 2013-08-01 DIAGNOSIS — M549 Dorsalgia, unspecified: Secondary | ICD-10-CM | POA: Diagnosis not present

## 2013-08-01 DIAGNOSIS — N390 Urinary tract infection, site not specified: Secondary | ICD-10-CM

## 2013-08-01 DIAGNOSIS — K219 Gastro-esophageal reflux disease without esophagitis: Secondary | ICD-10-CM

## 2013-08-01 DIAGNOSIS — O99891 Other specified diseases and conditions complicating pregnancy: Secondary | ICD-10-CM

## 2013-08-01 LAB — URINALYSIS, ROUTINE W REFLEX MICROSCOPIC
BILIRUBIN URINE: NEGATIVE
Glucose, UA: NEGATIVE mg/dL
Hgb urine dipstick: NEGATIVE
Ketones, ur: 15 mg/dL — AB
NITRITE: NEGATIVE
PH: 7 (ref 5.0–8.0)
Protein, ur: NEGATIVE mg/dL
Specific Gravity, Urine: 1.015 (ref 1.005–1.030)
Urobilinogen, UA: 4 mg/dL — ABNORMAL HIGH (ref 0.0–1.0)

## 2013-08-01 LAB — FETAL FIBRONECTIN: Fetal Fibronectin: NEGATIVE

## 2013-08-01 LAB — URINE MICROSCOPIC-ADD ON

## 2013-08-01 MED ORDER — PANTOPRAZOLE SODIUM 20 MG PO TBEC: 20.0000 mg | DELAYED_RELEASE_TABLET | Freq: Every day | ORAL | Status: DC

## 2013-08-01 MED ORDER — NIFEDIPINE 10 MG PO CAPS: 10.0000 mg | ORAL_CAPSULE | Freq: Three times a day (TID) | ORAL | Status: DC

## 2013-08-01 MED ORDER — NIFEDIPINE 10 MG PO CAPS
10.0000 mg | ORAL_CAPSULE | Freq: Once | ORAL | Status: AC
  Administered 2013-08-01: 10 mg via ORAL
  Filled 2013-08-01: qty 1

## 2013-08-01 MED ORDER — TRAMADOL HCL 50 MG PO TABS
50.0000 mg | ORAL_TABLET | Freq: Once | ORAL | Status: AC
Start: 2013-08-01 — End: 2013-08-01
  Administered 2013-08-01: 50 mg via ORAL
  Filled 2013-08-01: qty 1

## 2013-08-01 MED ORDER — CEPHALEXIN 500 MG PO CAPS: 500.0000 mg | ORAL_CAPSULE | Freq: Four times a day (QID) | ORAL | Status: DC

## 2013-08-01 MED ORDER — TRAMADOL HCL 50 MG PO TABS: 50.0000 mg | ORAL_TABLET | Freq: Four times a day (QID) | ORAL | Status: DC | PRN

## 2013-08-01 MED ORDER — ONDANSETRON 8 MG PO TBDP
8.0000 mg | ORAL_TABLET | Freq: Once | ORAL | Status: AC
  Administered 2013-08-01: 8 mg via ORAL
  Filled 2013-08-01: qty 1

## 2013-08-01 MED ORDER — CEPHALEXIN 500 MG PO CAPS
500.0000 mg | ORAL_CAPSULE | Freq: Once | ORAL | Status: AC
  Administered 2013-08-01: 500 mg via ORAL
  Filled 2013-08-01: qty 1

## 2013-08-01 NOTE — MAU Provider Note (Signed)
History     CSN: [id number]  Arrival date and time: 08/01/13 0040   First Provider Initiated Contact with Patient 08/01/13 0142      Chief Complaint  Patient presents with  . Contractions  . Emesis During Pregnancy   HPI This is a 23 y.o. Female at [redacted]w[redacted]d who presents with c/o vomiting multiple times all day and uterine and low back cramping and contractions. States pain is excrutiating. States is unable to keep anything down. Denies leaking or bleeding. Was seen 1-2 days ago for contractions and discharged home. Cervix felt to be FT/long then.   RN Note:  Contractions all day today; more than 6 per hour. Denies LOF or vaginal bleeding. Positive fetal movement. N/v all day, states unable to keep anything down.        OB History   Grav Para Term Preterm Abortions TAB SAB Ect Mult Living   3 1 1  1 1    1       Past Medical History  Diagnosis Date  . Medical history non-contributory     Past Surgical History  Procedure Laterality Date  . Eye surgery    . Induced abortion      Family History  Problem Relation Age of Onset  . Stroke Mother   . Thyroid disease Mother   . Kidney disease Brother   . Diabetes Paternal Uncle   . Kidney disease Maternal Grandfather     History  Substance Use Topics  . Smoking status: Former Smoker -- 0.25 packs/day    Types: Cigarettes    Quit date: 01/01/2013  . Smokeless tobacco: Not on file  . Alcohol Use: No    Allergies: No Known Allergies  Prescriptions prior to admission  Medication Sig Dispense Refill  . Doxylamine Succinate, Sleep, (UNISOM PO) Take by mouth.      . Prenatal Vit-Fe Fumarate-FA (PRENATAL MULTIVITAMIN) TABS tablet Take 1 tablet by mouth daily at 12 noon.      . pyridOXINE (VITAMIN B-6) 100 MG tablet Take 100 mg by mouth daily.        Review of Systems  Constitutional: Negative for fever and chills.  Gastrointestinal: Positive for nausea, vomiting and abdominal pain. Negative for diarrhea and  constipation.  Genitourinary: Negative for dysuria.  Musculoskeletal: Positive for back pain.  Neurological: Negative for headaches.   Physical Exam   Blood pressure 128/75, pulse 87, temperature 98.3 F (36.8 C), temperature source Oral, resp. rate 20, height 5\' 3"  (1.6 m), weight 57.153 kg (126 lb), last menstrual period 12/24/2012, not currently breastfeeding.  Physical Exam  Constitutional: She is oriented to person, place, and time. She appears well-developed and well-nourished. No distress.  HENT:  Head: Normocephalic.  Cardiovascular: Normal rate.   Respiratory: Effort normal.  GI: Soft.  Genitourinary: Vagina normal. No vaginal discharge found.  Dilation: Fingertip Effacement (%): 50 Station: -3 Exam by:: Wynelle Bourgeois CNM   Musculoskeletal: Normal range of motion.  Neurological: She is alert and oriented to person, place, and time.  Skin: Skin is warm and dry.  Psychiatric: She has a normal mood and affect.   FHR  Reassuring Tiny uterine irritability cramps noted, lasting less than 10 seconds each. No contractions.   MAU Course  Procedures  MDM FFn sent. WIll order Zofran and one dose of Procardia Cramps settled down. FFN NEGATIVE No vomiting while here. Patient now says it was not really vomiting but more acid and bile coming up. Tums does not work. Wants med for  back pain.  Results for orders placed during the hospital encounter of 08/01/13 (from the past 24 hour(s))  URINALYSIS, ROUTINE W REFLEX MICROSCOPIC     Status: Abnormal   Collection Time    08/01/13 12:55 AM      Result Value Ref Range   Color, Urine YELLOW  YELLOW   APPearance CLEAR  CLEAR   Specific Gravity, Urine 1.015  1.005 - 1.030   pH 7.0  5.0 - 8.0   Glucose, UA NEGATIVE  NEGATIVE mg/dL   Hgb urine dipstick NEGATIVE  NEGATIVE   Bilirubin Urine NEGATIVE  NEGATIVE   Ketones, ur 15 (*) NEGATIVE mg/dL   Protein, ur NEGATIVE  NEGATIVE mg/dL   Urobilinogen, UA 4.0 (*) 0.0 - 1.0 mg/dL    Nitrite NEGATIVE  NEGATIVE   Leukocytes, UA MODERATE (*) NEGATIVE  URINE MICROSCOPIC-ADD ON     Status: Abnormal   Collection Time    08/01/13 12:55 AM      Result Value Ref Range   Squamous Epithelial / LPF FEW (*) RARE   WBC, UA 3-6  <3 WBC/hpf   Bacteria, UA FEW (*) RARE   Urine-Other MUCOUS PRESENT    FETAL FIBRONECTIN     Status: None   Collection Time    08/01/13  1:23 AM      Result Value Ref Range   Fetal Fibronectin NEGATIVE  NEGATIVE    Assessment and Plan  A:  SIUP at 7125w3d        Uterine irritability       Possible UTI       Back Pain  P:  Discharge home       Rx Keflex and urine sent to culture       Rx Tramadol for pain       Rx Protonix       Followup in office/clinic         [facility] CenterWILLIAMS,Malvika Tung 08/01/2013, 1:51 AM

## 2013-08-01 NOTE — Discharge Instructions (Signed)
Back Pain in Pregnancy °Back pain during pregnancy is common. It happens in about half of all pregnancies. It is important for you and your baby that you remain active during your pregnancy. If you feel that back pain is not allowing you to remain active or sleep well, it is time to see your caregiver. Back pain may be caused by several factors related to changes during your pregnancy. Fortunately, unless you had trouble with your back before your pregnancy, the pain is likely to get better after you deliver. °Low back pain usually occurs between the fifth and seventh months of pregnancy. It can, however, happen in the first couple months. Factors that increase the risk of back problems include:  °· Previous back problems. °· Injury to your back. °· Having twins or multiple births. °· A chronic cough. °· Stress. °· Job-related repetitive motions. °· Muscle or spinal disease in the back. °· Family history of back problems, ruptured (herniated) discs, or osteoporosis. °· Depression, anxiety, and panic attacks. °CAUSES  °· When you are pregnant, your body produces a hormone called relaxin. This hormone makes the ligaments connecting the low back and pubic bones more flexible. This flexibility allows the baby to be delivered more easily. When your ligaments are loose, your muscles need to work harder to support your back. Soreness in your back can come from tired muscles. Soreness can also come from back tissues that are irritated since they are receiving less support. °· As the baby grows, it puts pressure on the nerves and blood vessels in your pelvis. This can cause back pain. °· As the baby grows and gets heavier during pregnancy, the uterus pushes the stomach muscles forward and changes your center of gravity. This makes your back muscles work harder to maintain good posture. °SYMPTOMS  °Lumbar pain during pregnancy °Lumbar pain during pregnancy usually occurs at or above the waist in the center of the back. There  may be pain and numbness that radiates into your leg or foot. This is similar to low back pain experienced by non-pregnant women. It usually increases with sitting for long periods of time, standing, or repetitive lifting. Tenderness may also be present in the muscles along your upper back. °Posterior pelvic pain during pregnancy °Pain in the back of the pelvis is more common than lumbar pain in pregnancy. It is a deep pain felt in your side at the waistline, or across the tailbone (sacrum), or in both places. You may have pain on one or both sides. This pain can also go into the buttocks and backs of the upper thighs. Pubic and groin pain may also be present. The pain does not quickly resolve with rest, and morning stiffness may also be present. °Pelvic pain during pregnancy can be brought on by most activities. A high level of fitness before and during pregnancy may or may not prevent this problem. Labor pain is usually 1 to 2 minutes apart, lasts for about 1 minute, and involves a bearing down feeling or pressure in your pelvis. However, if you are at term with the pregnancy, constant low back pain can be the beginning of early labor, and you should be aware of this. °DIAGNOSIS  °X-rays of the back should not be done during the first 12 to 14 weeks of the pregnancy and only when absolutely necessary during the rest of the pregnancy. MRIs do not give off radiation and are safe during pregnancy. MRIs also should only be done when absolutely necessary. °HOME CARE INSTRUCTIONS °· Exercise   as directed by your caregiver. Exercise is the most effective way to prevent or manage back pain. If you have a back problem, it is especially important to avoid sports that require sudden body movements. Swimming and walking are great activities. °· Do not stand in one place for long periods of time. °· Do not wear high heels. °· Sit in chairs with good posture. Use a pillow on your lower back if necessary. Make sure your head  rests over your shoulders and is not hanging forward. °· Try sleeping on your side, preferably the left side, with a pillow or two between your legs. If you are sore after a night's rest, your bed may be too soft. Try placing a board between your mattress and box spring. °· Listen to your body when lifting. If you are experiencing pain, ask for help or try bending your knees more so you can use your leg muscles rather than your back muscles. Squat down when picking up something from the floor. Do not bend over. °· Eat a healthy diet. Try to gain weight within your caregiver's recommendations. °· Use heat or cold packs 3 to 4 times a day for 15 minutes to help with the pain. °· Only take over-the-counter or prescription medicines for pain, discomfort, or fever as directed by your caregiver. °Sudden (acute) back pain °· Use bed rest for only the most extreme, acute episodes of back pain. Prolonged bed rest over 48 hours will aggravate your condition. °· Ice is very effective for acute conditions. °¨ Put ice in a plastic bag. °¨ Place a towel between your skin and the bag. °¨ Leave the ice on for 10 to 20 minutes every 2 hours, or as needed. °· Using heat packs for 30 minutes prior to activities is also helpful. °Continued back pain °See your caregiver if you have continued problems. Your caregiver can help or refer you for appropriate physical therapy. With conditioning, most back problems can be avoided. Sometimes, a more serious issue may be the cause of back pain. You should be seen right away if new problems seem to be developing. Your caregiver may recommend: °· A maternity girdle. °· An elastic sling. °· A back brace. °· A massage therapist or acupuncture. °SEEK MEDICAL CARE IF:  °· You are not able to do most of your daily activities, even when taking the pain medicine you were given. °· You need a referral to a physical therapist or chiropractor. °· You want to try acupuncture. °SEEK IMMEDIATE MEDICAL CARE  IF: °· You develop numbness, tingling, weakness, or problems with the use of your arms or legs. °· You develop severe back pain that is no longer relieved with medicines. °· You have a sudden change in bowel or bladder control. °· You have increasing pain in other areas of the body. °· You develop shortness of breath, dizziness, or fainting. °· You develop nausea, vomiting, or sweating. °· You have back pain which is similar to labor pains. °· You have back pain along with your water breaking or vaginal bleeding. °· You have back pain or numbness that travels down your leg. °· Your back pain developed after you fell. °· You develop pain on one side of your back. You may have a kidney stone. °· You see blood in your urine. You may have a bladder infection or kidney stone. °· You have back pain with blisters. You may have shingles. °Back pain is fairly common during pregnancy but should not be accepted as just part of   the process. Back pain should always be treated as soon as possible. This will make your pregnancy as pleasant as possible. Document Released: 04/03/2005 Document Revised: 03/18/2011 Document Reviewed: 05/15/2010 [facility] Patient Information 2015 [place]. This information is not intended to replace advice given to you by your health care provider. Make sure you discuss any questions you have with your health care provider. Gastroesophageal Reflux Disease, Adult Gastroesophageal reflux disease (GERD) happens when acid from your stomach flows up into the esophagus. When acid comes in contact with the esophagus, the acid causes soreness (inflammation) in the esophagus. Over time, GERD may create small holes (ulcers) in the lining of the esophagus. CAUSES   Increased body weight. This puts pressure on the stomach, making acid rise from the stomach into the esophagus.  Smoking. This increases acid production in the stomach.  Drinking alcohol. This causes decreased pressure in the lower  esophageal sphincter (valve or ring of muscle between the esophagus and stomach), allowing acid from the stomach into the esophagus.  Late evening meals and a full stomach. This increases pressure and acid production in the stomach.  A malformed lower esophageal sphincter. Sometimes, no cause is found. SYMPTOMS   Burning pain in the lower part of the mid-chest behind the breastbone and in the mid-stomach area. This may occur twice a week or more often.  Trouble swallowing.  Sore throat.  Dry cough.  Asthma-like symptoms including chest tightness, shortness of breath, or wheezing. DIAGNOSIS  Your caregiver may be able to diagnose GERD based on your symptoms. In some cases, X-rays and other tests may be done to check for complications or to check the condition of your stomach and esophagus. TREATMENT  Your caregiver may recommend over-the-counter or prescription medicines to help decrease acid production. Ask your caregiver before starting or adding any new medicines.  HOME CARE INSTRUCTIONS   Change the factors that you can control. Ask your caregiver for guidance concerning weight loss, quitting smoking, and alcohol consumption.  Avoid foods and drinks that make your symptoms worse, such as:  Caffeine or alcoholic drinks.  Chocolate.  Peppermint or mint flavorings.  Garlic and onions.  Spicy foods.  Citrus fruits, such as oranges, lemons, or limes.  Tomato-based foods such as sauce, chili, salsa, and pizza.  Fried and fatty foods.  Avoid lying down for the 3 hours prior to your bedtime or prior to taking a nap.  Eat small, frequent meals instead of large meals.  Wear loose-fitting clothing. Do not wear anything tight around your waist that causes pressure on your stomach.  Raise the head of your bed 6 to 8 inches with wood blocks to help you sleep. Extra pillows will not help.  Only take over-the-counter or prescription medicines for pain, discomfort, or fever as  directed by your caregiver.  Do not take aspirin, ibuprofen, or other nonsteroidal anti-inflammatory drugs (NSAIDs). SEEK IMMEDIATE MEDICAL CARE IF:   You have pain in your arms, neck, jaw, teeth, or back.  Your pain increases or changes in intensity or duration.  You develop nausea, vomiting, or sweating (diaphoresis).  You develop shortness of breath, or you faint.  Your vomit is green, yellow, black, or looks like coffee grounds or blood.  Your stool is red, bloody, or black. These symptoms could be signs of other problems, such as heart disease, gastric bleeding, or esophageal bleeding. MAKE SURE YOU:   Understand these instructions.  Will watch your condition.  Will get help right away if you are  not doing well or get worse. Document Released: 10/03/2004 Document Revised: 03/18/2011 Document Reviewed: 07/13/2010 [facility] Patient Information 2015 [place], [place]. This information is not intended to replace advice given to you by your health care provider. Make sure you discuss any questions you have with your health care provider.

## 2013-08-01 NOTE — MAU Provider Note (Signed)

## 2013-08-01 NOTE — MAU Note (Signed)
Contractions all day today; more than 6 per hour. Denies LOF or vaginal bleeding. Positive fetal movement. N/v all day, states unable to keep anything down.

## 2013-08-02 LAB — URINE CULTURE: SPECIAL REQUESTS: NORMAL

## 2013-08-25 ENCOUNTER — Encounter (HOSPITAL_COMMUNITY): Payer: Self-pay | Admitting: *Deleted

## 2013-08-25 ENCOUNTER — Inpatient Hospital Stay (HOSPITAL_COMMUNITY)
Admission: AD | Admit: 2013-08-25 | Discharge: 2013-08-25 | Disposition: A | Discharge status: Home / Self Care | Payer: Medicaid Other | Source: Ambulatory Visit | Attending: Family Medicine | Admitting: Family Medicine

## 2013-08-25 DIAGNOSIS — D649 Anemia, unspecified: Secondary | ICD-10-CM | POA: Insufficient documentation

## 2013-08-25 DIAGNOSIS — R109 Unspecified abdominal pain: Secondary | ICD-10-CM | POA: Diagnosis present

## 2013-08-25 DIAGNOSIS — R42 Dizziness and giddiness: Secondary | ICD-10-CM | POA: Diagnosis not present

## 2013-08-25 DIAGNOSIS — B3731 Acute candidiasis of vulva and vagina: Secondary | ICD-10-CM | POA: Insufficient documentation

## 2013-08-25 DIAGNOSIS — Z880 Allergy status to penicillin: Secondary | ICD-10-CM | POA: Insufficient documentation

## 2013-08-25 DIAGNOSIS — M549 Dorsalgia, unspecified: Secondary | ICD-10-CM | POA: Insufficient documentation

## 2013-08-25 DIAGNOSIS — B373 Candidiasis of vulva and vagina: Secondary | ICD-10-CM

## 2013-08-25 DIAGNOSIS — K219 Gastro-esophageal reflux disease without esophagitis: Secondary | ICD-10-CM | POA: Insufficient documentation

## 2013-08-25 LAB — BASIC METABOLIC PANEL WITH GFR
Anion gap: 12 (ref 5–15)
BUN: 9 mg/dL (ref 6–23)
CO2: 21 meq/L (ref 19–32)
Calcium: 9 mg/dL (ref 8.4–10.5)
Chloride: 102 meq/L (ref 96–112)
Creatinine, Ser: 0.52 mg/dL (ref 0.50–1.10)
GFR calc Af Amer: >90 mL/min
GFR calc non Af Amer: >90 mL/min
Glucose, Bld: 88 mg/dL (ref 70–99)
Potassium: 4.2 meq/L (ref 3.7–5.3)
Sodium: 135 meq/L — ABNORMAL LOW (ref 137–147)

## 2013-08-25 LAB — URINE MICROSCOPIC-ADD ON

## 2013-08-25 LAB — URINALYSIS, ROUTINE W REFLEX MICROSCOPIC
Bilirubin Urine: NEGATIVE
Glucose, UA: NEGATIVE mg/dL
Hgb urine dipstick: NEGATIVE
Ketones, ur: 15 mg/dL — AB
Nitrite: NEGATIVE
Protein, ur: NEGATIVE mg/dL
Specific Gravity, Urine: 1.02 (ref 1.005–1.030)
Urobilinogen, UA: 0.2 mg/dL (ref 0.0–1.0)
pH: 6.5 (ref 5.0–8.0)

## 2013-08-25 LAB — WET PREP, GENITAL
Clue Cells Wet Prep HPF POC: NONE SEEN
Trich, Wet Prep: NONE SEEN

## 2013-08-25 LAB — CBC
HCT: 29.3 % — ABNORMAL LOW (ref 36.0–46.0)
HEMOGLOBIN: 9.5 g/dL — AB (ref 12.0–15.0)
MCH: 28.6 pg (ref 26.0–34.0)
MCHC: 32.4 g/dL (ref 30.0–36.0)
MCV: 88.3 fL (ref 78.0–100.0)
Platelets: 284 10*3/uL (ref 150–400)
RBC: 3.32 MIL/uL — AB (ref 3.87–5.11)
RDW: 13.3 % (ref 11.5–15.5)
WBC: 12.1 10*3/uL — ABNORMAL HIGH (ref 4.0–10.5)

## 2013-08-25 LAB — POCT FERN TEST: POCT Fern Test: NEGATIVE

## 2013-08-25 MED ORDER — OMEPRAZOLE 10 MG PO CPDR: 10.0000 mg | DELAYED_RELEASE_CAPSULE | Freq: Every day | ORAL | Status: DC

## 2013-08-25 MED ORDER — MECLIZINE HCL 25 MG PO TABS
25.0000 mg | ORAL_TABLET | Freq: Once | ORAL | Status: AC
  Administered 2013-08-25: 25 mg via ORAL
  Filled 2013-08-25: qty 1

## 2013-08-25 MED ORDER — FLUCONAZOLE 150 MG PO TABS: 150.0000 mg | ORAL_TABLET | Freq: Every day | ORAL | Status: DC

## 2013-08-25 MED ORDER — DOCUSATE SODIUM 100 MG PO CAPS: 100.0000 mg | ORAL_CAPSULE | Freq: Two times a day (BID) | ORAL | Status: DC

## 2013-08-25 MED ORDER — FERROUS SULFATE 325 (65 FE) MG PO TABS: 325.0000 mg | ORAL_TABLET | Freq: Every day | ORAL | Status: DC

## 2013-08-25 MED ORDER — MECLIZINE HCL 12.5 MG PO TABS: 32.0000 mg | ORAL_TABLET | Freq: Three times a day (TID) | ORAL | Status: DC | PRN

## 2013-08-25 NOTE — MAU Note (Signed)
Chief Complaint:  Dizziness, Back Pain and Abdominal Pain   Allison [name] is  23 y.o. G3P1011 at [id number] presents complaining of Dizziness, Back Pain and Abdominal Pain She states the dizziness is with sitting and standing x 2-3days that worsened today to the point where she felt like she'd pass out. She endorsed feeling hot during this time with chest palpitations.She denies dehydration and states she drinks 8 glasses of water a day.  Urinating okay. Her abdominal pain is both at the lower lateral abdomen b/l and in the epigastric region. Pain in lower lateral abdomen is constant. Epigastric pain worsens with eating.   2 days ago she notice "2-3 chunks of dark red blood" the size of a dime, with no more recently. Has felt "gushes of fluid" that she isn't sure is her membranes rupturing or urine.  Occasional contraction.  Good fetal movement.   [facility]  EDD 10/04/13 by a [id number] U/S.    Obstetrical/Gynecological History: OB History   Grav Para Term Preterm Abortions TAB SAB Ect Mult Living   3 1 1  1 1    1      Past Medical History: Past Medical History  Diagnosis Date  . Medical history non-contributory     Past Surgical History: Past Surgical History  Procedure Laterality Date  . Eye surgery    . Induced abortion      Family History: Family History  Problem Relation Age of Onset  . Stroke Mother   . Thyroid disease Mother   . Kidney disease Brother   . Diabetes Paternal Uncle   . Kidney disease Maternal Grandfather     Social History: History  Substance Use Topics  . Smoking status: Former Smoker -- 0.25 packs/day    Types: Cigarettes    Quit date: 01/01/2013  . Smokeless tobacco: Not on file  . Alcohol Use: No  Smokes marijuana to help with N/V, denies smoking in the last few months.  Allergies: No Known Allergies  Meds:  Prescriptions prior to admission  Medication Sig Dispense Refill  . Prenatal Vit-Fe Fumarate-FA (PRENATAL MULTIVITAMIN) TABS  tablet Take 1 tablet by mouth daily at 12 noon.      . traMADol (ULTRAM) 50 MG tablet Take 1 tablet (50 mg total) by mouth every 6 (six) hours as needed for moderate pain.  30 tablet  0    Review of Systems -    Constitutional: Negative for fever, chills, weight loss, malaise/fatigue and diaphoresis.  HENT: Negative for hearing loss, ear pain, nosebleeds, congestion, sore throat, neck pain, tinnitus and ear discharge.   Eyes: Negative for blurred vision, double vision, photophobia, pain, discharge and redness.  Respiratory: Negative for cough, hemoptysis, sputum production, shortness of breath, wheezing and stridor.   Cardiovascular: Positive for palpitations, Negative for chest pain, orthopnea,  leg swelling  Gastrointestinal: Positive for abdominal pain, heartburn, nausea, vomiting (chronic). Negative for diarrhea, constipation, blood in stool Genitourinary: Negative for dysuria, urgency, frequency, hematuria and flank pain.  Musculoskeletal: Negative for myalgias, back pain, joint pain and falls.  Skin: Negative for itching and rash.  Neurological: Positive for dizziness. Negative for tingling, tremors, sensory change, speech change, focal weakness, seizures, loss of consciousness, weakness and headaches.  Endo/Heme/Allergies: Negative for environmental allergies and polydipsia. Does not bruise/bleed easily.  Psychiatric/Behavioral: Negative for depression, suicidal ideas, or current substance abuse.  Physical Exam  Blood pressure 119/69, pulse 91, temperature 97.9 F (36.6 C), resp. rate 16, last menstrual period 12/24/2012. GENERAL: Well-developed, well-nourished  female in no acute distress.  LUNGS: Clear to auscultation bilaterally.  HEART: Regular rate and rhythm. ABDOMEN: Soft, nondistended, gravid. Pain with palpation over the epigastric region without rebound or guarding. EXTREMITIES: Nontender, no edema, 2+ distal pulses. DTR's 2+ CERVICAL EXAM: No pooling of fluid in the  vaginal vault. No blood in the vaginal vault. Copious thick yellow discharge. FHT:  Baseline rate 140 bpm   Variability moderate  Accelerations present   Decelerations none Contractions: None   Labs: Results for orders placed during the hospital encounter of 08/25/13 (from the past 24 hour(s))  URINALYSIS, ROUTINE W REFLEX MICROSCOPIC   Collection Time    08/25/13  1:30 PM      Result Value Ref Range   Color, Urine YELLOW  YELLOW   APPearance CLEAR  CLEAR   Specific Gravity, Urine 1.020  1.005 - 1.030   pH 6.5  5.0 - 8.0   Glucose, UA NEGATIVE  NEGATIVE mg/dL   Hgb urine dipstick NEGATIVE  NEGATIVE   Bilirubin Urine NEGATIVE  NEGATIVE   Ketones, ur 15 (*) NEGATIVE mg/dL   Protein, ur NEGATIVE  NEGATIVE mg/dL   Urobilinogen, UA 0.2  0.0 - 1.0 mg/dL   Nitrite NEGATIVE  NEGATIVE   Leukocytes, UA SMALL (*) NEGATIVE  URINE MICROSCOPIC-ADD ON   Collection Time    08/25/13  1:30 PM      Result Value Ref Range   Squamous Epithelial / LPF FEW (*) RARE   WBC, UA 7-10  <3 WBC/hpf   RBC / HPF 3-6  <3 RBC/hpf   Bacteria, UA FEW (*) RARE   Urine-Other FEW YEAST    WET PREP, GENITAL   Collection Time    08/25/13  3:49 PM      Result Value Ref Range   Yeast Wet Prep HPF POC FEW (*) NONE SEEN   Trich, Wet Prep NONE SEEN  NONE SEEN   Clue Cells Wet Prep HPF POC NONE SEEN  NONE SEEN   WBC, Wet Prep HPF POC MANY (*) NONE SEEN  POCT FERN TEST   Collection Time    08/25/13  3:53 PM      Result Value Ref Range   POCT Fern Test Negative = intact amniotic membranes    CBC   Collection Time    08/25/13  4:09 PM      Result Value Ref Range   WBC 12.1 (*) 4.0 - 10.5 K/uL   RBC 3.32 (*) 3.87 - 5.11 MIL/uL   Hemoglobin 9.5 (*) 12.0 - 15.0 g/dL   HCT 16.1 (*) 09.6 - 04.5 %   MCV 88.3  78.0 - 100.0 fL   MCH 28.6  26.0 - 34.0 pg   MCHC 32.4  30.0 - 36.0 g/dL   RDW 40.9  81.1 - 91.4 %   Platelets 284  150 - 400 K/uL  BASIC METABOLIC PANEL   Collection Time    08/25/13  4:09 PM      Result  Value Ref Range   Sodium 135 (*) 137 - 147 mEq/L   Potassium 4.2  3.7 - 5.3 mEq/L   Chloride 102  96 - 112 mEq/L   CO2 21  19 - 32 mEq/L   Glucose, Bld 88  70 - 99 mg/dL   BUN 9  6 - 23 mg/dL   Creatinine, Ser 7.82  0.50 - 1.10 mg/dL   Calcium 9.0  8.4 - 95.6 mg/dL   GFR calc non Af Amer >90  >90 mL/min  GFR calc Af Amer >90  >90 mL/min   Anion gap 12  5 - 15   Imaging Studies:  No results found.  Assessment: Sherea Liptak is  23 y.o. G3P1011 at [redacted]w[redacted]d presents with dizziness, worse with movement, abdominal pain over the lower anterolateral abdomen and epigastric region with some acid reflux.  No signs of pre-term labor: no contractions noted on FHT, ferning negative on exam, cervical unchanged from her last presentation to the MAU. Her epigastric pain sounds consistent with acid reflux- non-surgical abdomen on exam. The lateral pain appears consistent with round ligament pain. U/A with few squamous cells, bacteria, and small leukocytes, negative for nitrites. No dysuria, urinary urgency, or increased urinary frequency per pt. From U/A and BMP, patient does not appear dehydrated.  Dizziness with movement sounds consistent with vertigo.  No history of LOC or falls per patient. Tried one dose of meclizine in MAU with improvement. Of note, yeast noted on speculum exam.   Plan: - Prilosec for acid reflux - Meclizine for dizziness, discussed the importance of staying hydrated and standing up slowly.  - Diflucan for yeast infection  - Mildly anemic on CBC, will start ferrous sulfate and colace - Advised pt to [facility]. - Discussed onset of labor symptoms and when to seek medical assistance.  Joanna Puff 8/19/20155:34 PM Evaluation and management procedures were performed by Resident physician under my supervision/collaboration. Chart reviewed, patient examined by me and I agree with management and plan.

## 2013-08-25 NOTE — Discharge Instructions (Signed)
Second Trimester of Pregnancy The second trimester is from week 13 through week 28, months 4 through 6. The second trimester is often a time when you feel your best. Your body has also adjusted to being pregnant, and you begin to feel better physically. Usually, morning sickness has lessened or quit completely, you may have more energy, and you may have an increase in appetite. The second trimester is also a time when the fetus is growing rapidly. At the end of the sixth month, the fetus is about 9 inches long and weighs about 1 pounds. You will likely begin to feel the baby move (quickening) between 18 and 20 weeks of the pregnancy. BODY CHANGES Your body goes through many changes during pregnancy. The changes vary from woman to woman.   Your weight will continue to increase. You will notice your lower abdomen bulging out.  You may begin to get stretch marks on your hips, abdomen, and breasts.  You may develop headaches that can be relieved by medicines approved by your health care provider.  You may urinate more often because the fetus is pressing on your bladder.  You may develop or continue to have heartburn as a result of your pregnancy.  You may develop constipation because certain hormones are causing the muscles that push waste through your intestines to slow down.  You may develop hemorrhoids or swollen, bulging veins (varicose veins).  You may have back pain because of the weight gain and pregnancy hormones relaxing your joints between the bones in your pelvis and as a result of a shift in weight and the muscles that support your balance.  Your breasts will continue to grow and be tender.  Your gums may bleed and may be sensitive to brushing and flossing.  Dark spots or blotches (chloasma, mask of pregnancy) may develop on your face. This will likely fade after the baby is born.  A dark line from your belly button to the pubic area (linea nigra) may appear. This will likely fade  after the baby is born.  You may have changes in your hair. These can include thickening of your hair, rapid growth, and changes in texture. Some women also have hair loss during or after pregnancy, or hair that feels dry or thin. Your hair will most likely return to normal after your baby is born. WHAT TO EXPECT AT YOUR PRENATAL VISITS During a routine prenatal visit:  You will be weighed to make sure you and the fetus are growing normally.  Your blood pressure will be taken.  Your abdomen will be measured to track your baby's growth.  The fetal heartbeat will be listened to.  Any test results from the previous visit will be discussed. Your health care provider may ask you:  How you are feeling.  If you are feeling the baby move.  If you have had any abnormal symptoms, such as leaking fluid, bleeding, severe headaches, or abdominal cramping.  If you have any questions. Other tests that may be performed during your second trimester include:  Blood tests that check for:  Low iron levels (anemia).  Gestational diabetes (between 24 and 28 weeks).  Rh antibodies.  Urine tests to check for infections, diabetes, or protein in the urine.  An ultrasound to confirm the proper growth and development of the baby.  An amniocentesis to check for possible genetic problems.  Fetal screens for spina bifida and Down syndrome. HOME CARE INSTRUCTIONS   Avoid all smoking, herbs, alcohol, and unprescribed   drugs. These chemicals affect the formation and growth of the baby.  Follow your health care provider's instructions regarding medicine use. There are medicines that are either safe or unsafe to take during pregnancy.  Exercise only as directed by your health care provider. Experiencing uterine cramps is a good sign to stop exercising.  Continue to eat regular, healthy meals.  Wear a good support bra for breast tenderness.  Do not use hot tubs, steam rooms, or saunas.  Wear your  seat belt at all times when driving.  Avoid raw meat, uncooked cheese, cat litter boxes, and soil used by cats. These carry germs that can cause birth defects in the baby.  Take your prenatal vitamins.  Try taking a stool softener (if your health care provider approves) if you develop constipation. Eat more high-fiber foods, such as fresh vegetables or fruit and whole grains. Drink plenty of fluids to keep your urine clear or pale yellow.  Take warm sitz baths to soothe any pain or discomfort caused by hemorrhoids. Use hemorrhoid cream if your health care provider approves.  If you develop varicose veins, wear support hose. Elevate your feet for 15 minutes, 3-4 times a day. Limit salt in your diet.  Avoid heavy lifting, wear low heel shoes, and practice good posture.  Rest with your legs elevated if you have leg cramps or low back pain.  Visit your dentist if you have not gone yet during your pregnancy. Use a soft toothbrush to brush your teeth and be gentle when you floss.  A sexual relationship may be continued unless your health care provider directs you otherwise.  Continue to go to all your prenatal visits as directed by your health care provider. SEEK MEDICAL CARE IF:   You have dizziness.  You have mild pelvic cramps, pelvic pressure, or nagging pain in the abdominal area.  You have persistent nausea, vomiting, or diarrhea.  You have a bad smelling vaginal discharge.  You have pain with urination. SEEK IMMEDIATE MEDICAL CARE IF:   You have a fever.  You are leaking fluid from your vagina.  You have spotting or bleeding from your vagina.  You have severe abdominal cramping or pain.  You have rapid weight gain or loss.  You have shortness of breath with chest pain.  You notice sudden or extreme swelling of your face, hands, ankles, feet, or legs.  You have not felt your baby move in over an hour.  You have severe headaches that do not go away with  medicine.  You have vision changes. Document Released: 12/18/2000 Document Revised: 12/29/2012 Document Reviewed: 02/25/2012 [employer] Patient Information 2015 [employer]. This information is not intended to replace advice given to you by your health care provider. Make sure you discuss any questions you have with your health care provider.  

## 2013-08-25 NOTE — MAU Note (Signed)
Urine in lab 

## 2013-08-25 NOTE — MAU Note (Signed)
Pt presents to MAU with complaints of dizziness, back pain and abdominal pain since around 11 when she was at work. Denies any vaginal bleeding, reports some leakage of fluid.

## 2013-08-27 LAB — OB RESULTS CONSOLE GBS: STREP GROUP B AG: NEGATIVE

## 2013-09-05 ENCOUNTER — Encounter (HOSPITAL_COMMUNITY): Payer: Self-pay | Admitting: *Deleted

## 2013-09-05 ENCOUNTER — Observation Stay (HOSPITAL_COMMUNITY)
Admission: AD | Admit: 2013-09-05 | Discharge: 2013-09-05 | Disposition: A | Discharge status: Home / Self Care | Payer: Medicaid Other | Source: Ambulatory Visit | Attending: Obstetrics & Gynecology | Admitting: Obstetrics & Gynecology

## 2013-09-05 DIAGNOSIS — Z79899 Other long term (current) drug therapy: Secondary | ICD-10-CM | POA: Diagnosis not present

## 2013-09-05 DIAGNOSIS — O4700 False labor before 37 completed weeks of gestation, unspecified trimester: Secondary | ICD-10-CM | POA: Diagnosis present

## 2013-09-05 DIAGNOSIS — Z87891 Personal history of nicotine dependence: Secondary | ICD-10-CM | POA: Insufficient documentation

## 2013-09-05 LAB — URINALYSIS, ROUTINE W REFLEX MICROSCOPIC
Bilirubin Urine: NEGATIVE
GLUCOSE, UA: NEGATIVE mg/dL
Ketones, ur: NEGATIVE mg/dL
Nitrite: NEGATIVE
Protein, ur: NEGATIVE mg/dL
Specific Gravity, Urine: 1.01 (ref 1.005–1.030)
Urobilinogen, UA: 0.2 mg/dL (ref 0.0–1.0)
pH: 6.5 (ref 5.0–8.0)

## 2013-09-05 LAB — URINE MICROSCOPIC-ADD ON

## 2013-09-05 LAB — TYPE AND SCREEN
ABO/RH(D): O POS
ANTIBODY SCREEN: NEGATIVE

## 2013-09-05 LAB — SAMPLE TO BLOOD BANK

## 2013-09-05 LAB — CBC
HCT: 30 % — ABNORMAL LOW (ref 36.0–46.0)
Hemoglobin: 9.7 g/dL — ABNORMAL LOW (ref 12.0–15.0)
MCH: 28.1 pg (ref 26.0–34.0)
MCHC: 32.3 g/dL (ref 30.0–36.0)
MCV: 87 fL (ref 78.0–100.0)
Platelets: 287 10*3/uL (ref 150–400)
RBC: 3.45 MIL/uL — AB (ref 3.87–5.11)
RDW: 13.7 % (ref 11.5–15.5)
WBC: 13 10*3/uL — ABNORMAL HIGH (ref 4.0–10.5)

## 2013-09-05 LAB — GROUP B STREP BY PCR: Group B strep by PCR: NEGATIVE

## 2013-09-05 LAB — RPR

## 2013-09-05 LAB — ABO/RH: ABO/RH(D): O POS

## 2013-09-05 MED ORDER — EPHEDRINE 5 MG/ML INJ: 10.0000 mg | INTRAVENOUS | Status: DC | PRN

## 2013-09-05 MED ORDER — OXYTOCIN 40 UNITS IN LACTATED RINGERS INFUSION - SIMPLE MED: 62.5000 mL/h | INTRAVENOUS | Status: DC

## 2013-09-05 MED ORDER — IBUPROFEN 600 MG PO TABS: 600.0000 mg | ORAL_TABLET | Freq: Four times a day (QID) | ORAL | Status: DC | PRN

## 2013-09-05 MED ORDER — LACTATED RINGERS IV SOLN: 500.0000 mL | Freq: Once | INTRAVENOUS | Status: DC

## 2013-09-05 MED ORDER — OXYTOCIN BOLUS FROM INFUSION: 500.0000 mL | INTRAVENOUS | Status: DC

## 2013-09-05 MED ORDER — PHENYLEPHRINE 40 MCG/ML (10ML) SYRINGE FOR IV PUSH (FOR BLOOD PRESSURE SUPPORT): 80.0000 ug | PREFILLED_SYRINGE | INTRAVENOUS | Status: DC | PRN

## 2013-09-05 MED ORDER — LACTATED RINGERS IV SOLN
INTRAVENOUS | Status: DC
  Administered 2013-09-05 (×2): via INTRAVENOUS

## 2013-09-05 MED ORDER — FENTANYL 2.5 MCG/ML BUPIVACAINE 1/10 % EPIDURAL INFUSION (WH - ANES): 14.0000 mL/h | INTRAMUSCULAR | Status: DC | PRN

## 2013-09-05 MED ORDER — FLEET ENEMA 7-19 GM/118ML RE ENEM: 1.0000 | ENEMA | RECTAL | Status: DC | PRN

## 2013-09-05 MED ORDER — ACETAMINOPHEN 325 MG PO TABS: 650.0000 mg | ORAL_TABLET | ORAL | Status: DC | PRN

## 2013-09-05 MED ORDER — FENTANYL CITRATE 0.05 MG/ML IJ SOLN
100.0000 ug | INTRAMUSCULAR | Status: DC | PRN
  Administered 2013-09-05 (×3): 100 ug via INTRAVENOUS
  Filled 2013-09-05 (×3): qty 2

## 2013-09-05 MED ORDER — LACTATED RINGERS IV SOLN: 500.0000 mL | INTRAVENOUS | Status: DC | PRN

## 2013-09-05 MED ORDER — DIPHENHYDRAMINE HCL 50 MG/ML IJ SOLN: 12.5000 mg | INTRAMUSCULAR | Status: DC | PRN

## 2013-09-05 MED ORDER — HYDROXYZINE HCL 50 MG PO TABS: 50.0000 mg | ORAL_TABLET | Freq: Four times a day (QID) | ORAL | Status: DC | PRN

## 2013-09-05 MED ORDER — OXYCODONE-ACETAMINOPHEN 5-325 MG PO TABS
1.0000 | ORAL_TABLET | ORAL | Status: DC | PRN
  Administered 2013-09-05: 2 via ORAL

## 2013-09-05 MED ORDER — PENICILLIN G POTASSIUM 5000000 UNITS IJ SOLR
2.5000 10*6.[IU] | INTRAMUSCULAR | Status: DC
  Filled 2013-09-05 (×2): qty 2.5

## 2013-09-05 MED ORDER — OXYCODONE-ACETAMINOPHEN 5-325 MG PO TABS
2.0000 | ORAL_TABLET | ORAL | Status: DC
  Filled 2013-09-05: qty 2

## 2013-09-05 MED ORDER — LIDOCAINE HCL (PF) 1 % IJ SOLN: 30.0000 mL | INTRAMUSCULAR | Status: DC | PRN

## 2013-09-05 MED ORDER — ONDANSETRON HCL 4 MG/2ML IJ SOLN: 4.0000 mg | Freq: Four times a day (QID) | INTRAMUSCULAR | Status: DC | PRN

## 2013-09-05 MED ORDER — PENICILLIN G POTASSIUM 5000000 UNITS IJ SOLR
5.0000 10*6.[IU] | Freq: Once | INTRAMUSCULAR | Status: DC
  Administered 2013-09-05: 5 10*6.[IU] via INTRAVENOUS
  Filled 2013-09-05: qty 5

## 2013-09-05 MED ORDER — CITRIC ACID-SODIUM CITRATE 334-500 MG/5ML PO SOLN
30.0000 mL | ORAL | Status: DC | PRN
Start: 2013-09-05 — End: 2013-09-05

## 2013-09-05 NOTE — Discharge Instructions (Signed)
Reasons to return to MAU:  1.  Contractions are  5 minutes apart or less, each last 1 minute, these have been going on for 1-2 hours, and you cannot walk or talk during them 2.  You have a large gush of fluid, or a trickle of fluid that will not stop and you have to wear a pad 3.  You have bleeding that is bright red, heavier than spotting--like menstrual bleeding (spotting can be normal in early labor or after a check of your cervix) 4.  You do not feel the baby moving like he/she normally does  Vaginal Delivery During delivery, your health care provider will help you give birth to your baby. During a vaginal delivery, you will work to push the baby out of your vagina. However, before you can push your baby out, a few things need to happen. The opening of your uterus (cervix) has to soften, thin out, and open up (dilate) all the way to 10 cm. Also, your baby has to move down from the uterus into your vagina.  SIGNS OF LABOR  Your health care provider will first need to make sure you are in labor. Signs of labor include:   Passing what is called the mucous plug before labor begins. This is a small amount of blood-stained mucus.  Having regular, painful uterine contractions.   The time between contractions gets shorter.   The discomfort and pain gradually get more intense.  Contraction pains get worse when walking and do not go away when resting.   Your cervix becomes thinner (effacement) and dilates. BEFORE THE DELIVERY Once you are in labor and admitted into the hospital or care center, your health care provider may do the following:   Perform a complete physical exam.  Review any complications related to pregnancy or labor.  Check your blood pressure, pulse, temperature, and heart rate (vital signs).   Determine if, and when, the rupture of amniotic membranes occurred.  Do a vaginal exam (using a sterile glove and lubricant) to determine:   The position (presentation) of  the baby. Is the baby's head presenting first (vertex) in the birth canal (vagina), or are the feet or buttocks first (breech)?   The level (station) of the baby's head within the birth canal.   The effacement and dilatation of the cervix.   An electronic fetal monitor is usually placed on your abdomen when you first arrive. This is used to monitor your contractions and the baby's heart rate.  When the monitor is on your abdomen (external fetal monitor), it can only pick up the frequency and length of your contractions. It cannot tell the strength of your contractions.  If it becomes necessary for your health care provider to know exactly how strong your contractions are or to see exactly what the baby's heart rate is doing, an internal monitor may be inserted into your vagina and uterus. Your health care provider will discuss the benefits and risks of using an internal monitor and obtain your permission before inserting the device.  Continuous fetal monitoring may be needed if you have an epidural, are receiving certain medicines (such as oxytocin), or have pregnancy or labor complications.  An IV access tube may be placed into a vein in your arm to deliver fluids and medicines if necessary. THREE STAGES OF LABOR AND DELIVERY Normal labor and delivery is divided into three stages. First Stage This stage starts when you begin to contract regularly and your cervix begins to efface  and dilate. It ends when your cervix is completely open (fully dilated). The first stage is the longest stage of labor and can last from 3 hours to 15 hours.  Several methods are available to help with labor pain. You and your health care provider will decide which option is best for you. Options include:   Opioid medicines. These are strong pain medicines that you can get through your IV tube or as a shot into your muscle. These medicines lessen pain but do not make it go away completely.  Epidural. A medicine is  given through a thin tube that is inserted in your back. The medicine numbs the lower part of your body and prevents any pain in that area.  Paracervical pain medicine. This is an injection of an anesthetic on each side of your cervix.   You may request natural childbirth, which does not involve the use of pain medicines or an epidural during labor and delivery. Instead, you will use other things, such as breathing exercises, to help cope with the pain. Second Stage The second stage of labor begins when your cervix is fully dilated at 10 cm. It continues until you push your baby down through the birth canal and the baby is born. This stage can take only minutes or several hours.  The location of your baby's head as it moves through the birth canal is reported as a number called a station. If the baby's head has not started its descent, the station is described as being at minus 3 (-3). When your baby's head is at the zero station, it is at the middle of the birth canal and is engaged in the pelvis. The station of your baby helps indicate the progress of the second stage of labor.  When your baby is born, your health care provider may hold the baby with his or her head lowered to prevent amniotic fluid, mucus, and blood from getting into the baby's lungs. The baby's mouth and nose may be suctioned with a small bulb syringe to remove any additional fluid.  Your health care provider may then place the baby on your stomach. It is important to keep the baby from getting cold. To do this, the health care provider will dry the baby off, place the baby directly on your skin (with no blankets between you and the baby), and cover the baby with warm, dry blankets.   The umbilical cord is cut. Third Stage During the third stage of labor, your health care provider will deliver the placenta (afterbirth) and make sure your bleeding is under control. The delivery of the placenta usually takes about 5 minutes but  can take up to 30 minutes. After the placenta is delivered, a medicine may be given either by IV or injection to help contract the uterus and control bleeding. If you are planning to breastfeed, you can try to do so now. After you deliver the placenta, your uterus should contract and get very firm. If your uterus does not remain firm, your health care provider will massage it. This is important because the contraction of the uterus helps cut off bleeding at the site where the placenta was attached to your uterus. If your uterus does not contract properly and stay firm, you may continue to bleed heavily. If there is a lot of bleeding, medicines may be given to contract the uterus and stop the bleeding.  Document Released: 10/03/2007 Document Revised: 05/10/2013 Document Reviewed: 06/14/2012 [facility] Patient Information 2015 [employer]. This information is not intended to replace advice given to you by your health care provider. Make sure you discuss any questions you have with your health care provider.

## 2013-09-05 NOTE — Progress Notes (Signed)
Allison [name] is a 23 y.o. G3P1011 at [redacted]w[redacted]d admitted for active labor  Subjective: Pt uncomfortable with contractions, called to room because pt reports stronger contractions, requesting pain medications.  Objective: BP 129/66  Pulse 85  Temp(Src) 98.4 F (36.9 C) (Oral)  Resp 18  Ht  (1.6 m)  Wt 64.411 kg (142 lb)  BMI 25.16 kg/m2  LMP 12/24/2012      FHT:  FHR: 135 bpm, variability: moderate,  accelerations:  Present,  decelerations:  Absent UC:   Regular, every 2-3 minutes, mild to moderate to palpation  SVE:   Dilation: 5 Effacement (%): 80 Station: -2 Exam by:: Harshil Cavallaro leftwich-kirby cnm Vertex, blood show noted with exam. Cervix unchanged from earlier exam.  Labs: Lab Results  Component Value Date   WBC 13.0* 09/05/2013   HGB 9.7* 09/05/2013   HCT 30.0* 09/05/2013   MCV 87.0 09/05/2013   PLT 287 09/05/2013    Assessment / Plan: Spontaneous labor, progressing normally  Labor: Progressing normally Preeclampsia:  n/a Fetal Wellbeing:  Category I Pain Control:  Fentanyl I/D:  n/a Anticipated MOD:  NSVD  LEFTWICH-KIRBY, Anylah Scheib 09/05/2013, 12:04 PM

## 2013-09-05 NOTE — Discharge Summary (Signed)
Obstetric Discharge Summary Reason for Admission: preterm contractions with cervical change from 4-5 cm on 09/05/13 Prenatal Procedures: ultrasound  Hemoglobin  Date Value Ref Range Status  09/05/2013 9.7* 12.0 - 15.0 g/dL Final     HCT  Date Value Ref Range Status  09/05/2013 30.0* 36.0 - 46.0 % Final    Physical Exam:  Physical Examination: General appearance - alert, well appearing, and in no distress, oriented to person, place, and time and acyanotic, in no respiratory distress DVT Evaluation: No evidence of DVT seen on physical exam.  Discharge Diagnoses: Preterm contractions with no cervical change in 12 hours on [facility] Intact membranes, GBS negative Discharge Information: Date: 09/05/2013 Activity: pelvic rest Diet: routine Medications: None Condition: stable Instructions: Preterm labor precautions given, warning signs/reasons to come to hospital Discharge to: home  Keep scheduled appointment on Friday at [facility].  Follow-up Information   Follow up with [facility]. (As scheduled or return to MAU as needed for emergencies or signs of labor.)    Contact information:   [address] [place] [address] [phone]      LEFTWICH-KIRBY, Misty Stanley 09/05/2013, 6:15 PM

## 2013-09-05 NOTE — MAU Note (Signed)
Contractions since 2100. Denies bleeding or leaking fld. 

## 2013-09-05 NOTE — MAU Provider Note (Signed)
History  Chief Complaint:  Contractions  Allison [name] is a 23 y.o. G63P1011 female at [redacted]w[redacted]d presenting w/ report of uc's since 2100.   Reports active fetal movement, contractions: becoming more frequent/stronger, vaginal bleeding: none, membranes: intact. Denies abnormal/malodorous vag d/c or vulvovaginal itching/irritation.  Some increased urinary frequency, no dysuria, but some pressure when voiding.  Prenatal care at [facility].  Pregnancy complicated by none.  Obstetrical History: OB History   Grav Para Term Preterm Abortions TAB SAB Ect Mult Living   Past Medical History: Past Medical History  Diagnosis Date  . Medical history non-contributory     Past Surgical History: Past Surgical History  Procedure Laterality Date  . Eye surgery    . Induced abortion      Social History: History   Social History  . Marital Status: Single    Spouse Name: N/A    Number of Children: N/A  . Years of Education: N/A   Social History Main Topics  . Smoking status: Former Smoker -- 0.25 packs/day    Types: Cigarettes    Quit date: 01/01/2013  . Smokeless tobacco: None  . Alcohol Use: No  . Drug Use: No     Comment: quit at beginning of pregnancy  . Sexual Activity: Yes   Other Topics Concern  . None   Social History Narrative  . None    Allergies: No Known Allergies  Prescriptions prior to admission  Medication Sig Dispense Refill  . docusate sodium (COLACE) 100 MG capsule Take 1 capsule (100 mg total) by mouth 2 (two) times daily.  10 capsule  0  . ferrous sulfate 325 (65 FE) MG tablet Take 1 tablet (325 mg total) by mouth daily with breakfast.  30 tablet  2  . meclizine (ANTIVERT) 12.5 MG tablet Take 2.5 tablets (31.25 mg total) by mouth 3 (three) times daily as needed.  15 tablet  0  . omeprazole (PRILOSEC) 10 MG capsule Take 1 capsule (10 mg total) by mouth daily.  30 capsule  0  . Prenatal Vit-Fe Fumarate-FA (PRENATAL MULTIVITAMIN) TABS tablet Take 1  tablet by mouth daily at 12 noon.      . traMADol (ULTRAM) 50 MG tablet Take 1 tablet (50 mg total) by mouth every 6 (six) hours as needed for moderate pain.  30 tablet  0  . fluconazole (DIFLUCAN) 150 MG tablet Take 1 tablet (150 mg total) by mouth daily.  1 tablet  0    Review of Systems  Pertinent pos/neg as indicated in HPI  Physical Exam  Blood pressure 120/68, pulse 82, temperature 98.3 F (36.8 C), temperature source Oral, resp. rate 18, height  (1.6 m), weight 64.411 kg (142 lb), last menstrual period 12/24/2012. General appearance: alert, cooperative and no distress Lungs: clear to auscultation bilaterally, normal effort Heart: regular rate and rhythm Abdomen: gravid, soft, non-tender Extremities: No edema  Dilation: 4.5 Effacement (%): 70 Cervical Position: Middle Station: -2 Presentation: Vertex Exam by:: Claud Kelp RN Presentation: cephalic  Fetal monitoring: FHR: 140 bpm, variability: moderate,  Accelerations: Present,  decelerations:  Absent Uterine activity: 2-4  MAU Course  SVE x 3, on last exam had changed from 4 to 4.5 UA  Labs:  Results for orders placed during the hospital encounter of 09/05/13 (from the past 24 hour(s))  URINALYSIS, ROUTINE W REFLEX MICROSCOPIC     Status: Abnormal   Collection Time  09/05/13  3:10 AM      Result Value Ref Range   Color, Urine YELLOW  YELLOW   APPearance CLEAR  CLEAR   Specific Gravity, Urine 1.010  1.005 - 1.030   pH 6.5  5.0 - 8.0   Glucose, UA NEGATIVE  NEGATIVE mg/dL   Hgb urine dipstick SMALL (*) NEGATIVE   Bilirubin Urine NEGATIVE  NEGATIVE   Ketones, ur NEGATIVE  NEGATIVE mg/dL   Protein, ur NEGATIVE  NEGATIVE mg/dL   Urobilinogen, UA 0.2  0.0 - 1.0 mg/dL   Nitrite NEGATIVE  NEGATIVE   Leukocytes, UA SMALL (*) NEGATIVE  URINE MICROSCOPIC-ADD ON     Status: Abnormal   Collection Time    09/05/13  3:10 AM      Result Value Ref Range   Squamous Epithelial / LPF FEW (*) RARE   WBC, UA 3-6  <3  WBC/hpf   RBC / HPF 0-2  <3 RBC/hpf   Bacteria, UA RARE  RARE  GROUP B STREP BY PCR     Status: None   Collection Time    09/05/13  5:10 AM      Result Value Ref Range   Group B strep by PCR NEGATIVE  NEGATIVE  CBC     Status: Abnormal   Collection Time    09/05/13  5:30 AM      Result Value Ref Range   WBC 13.0 (*) 4.0 - 10.5 K/uL   RBC 3.45 (*) 3.87 - 5.11 MIL/uL   Hemoglobin 9.7 (*) 12.0 - 15.0 g/dL   HCT 16.1 (*) 09.6 - 04.5 %   MCV 87.0  78.0 - 100.0 fL   MCH 28.1  26.0 - 34.0 pg   MCHC 32.3  30.0 - 36.0 g/dL   RDW 40.9  81.1 - 91.4 %   Platelets 287  150 - 400 K/uL  SAMPLE TO BLOOD BANK     Status: None   Collection Time    09/05/13  5:30 AM      Result Value Ref Range   Blood Bank Specimen SAMPLE AVAILABLE FOR TESTING     Sample Expiration 09/08/2013    TYPE AND SCREEN     Status: None   Collection Time    09/05/13  5:30 AM      Result Value Ref Range   ABO/RH(D) O POS     Antibody Screen NEG     Sample Expiration 09/08/2013      Imaging:  n/a  Assessment and Plan  A:  [redacted]w[redacted]d SIUP  G3P1011  Spontaneous preterm labor  Cat 1 FHR P:  Admit to BS  Expectant management  GBS pcr, PCN per protocol, can d/c if pcr returns neg  IV pain meds only for now, epidural prn if continues to progress  Anticipate NSVD   Marge Duncans CNM,WHNP-BC 8/30/20157:04 AM

## 2013-09-05 NOTE — H&P (Signed)
Allison [name] is a 23 y.o. G75P1011 female at [redacted]w[redacted]d by 6wk u/s, presenting w/ report of uc's since 2100.   Reports active fetal movement, contractions: becoming more frequent/stronge, vaginal bleeding: none, membranes: intact. Initiated prenatal care at [facility] at 20 wks.   Pregnancy complicated by limited pnc.  Past Medical History: Past Medical History  Diagnosis Date  . Medical history non-contributory     Past Surgical History: Past Surgical History  Procedure Laterality Date  . Eye surgery    . Induced abortion      Obstetrical History: OB History   Grav Para Term Preterm Abortions TAB SAB Ect Mult Living   Social History: History   Social History  . Marital Status: Single    Spouse Name: N/A    Number of Children: N/A  . Years of Education: N/A   Social History Main Topics  . Smoking status: Former Smoker -- 0.25 packs/day    Types: Cigarettes    Quit date: 01/01/2013  . Smokeless tobacco: None  . Alcohol Use: No  . Drug Use: No     Comment: quit at beginning of pregnancy  . Sexual Activity: Yes   Other Topics Concern  . None   Social History Narrative  . None    Family History: Family History  Problem Relation Age of Onset  . Stroke Mother   . Thyroid disease Mother   . Kidney disease Brother   . Diabetes Paternal Uncle   . Kidney disease Maternal Grandfather     Allergies: No Known Allergies  Prescriptions prior to admission  Medication Sig Dispense Refill  . docusate sodium (COLACE) 100 MG capsule Take 1 capsule (100 mg total) by mouth 2 (two) times daily.  10 capsule  0  . ferrous sulfate 325 (65 FE) MG tablet Take 1 tablet (325 mg total) by mouth daily with breakfast.  30 tablet  2  . meclizine (ANTIVERT) 12.5 MG tablet Take 2.5 tablets (31.25 mg total) by mouth 3 (three) times daily as needed.  15 tablet  0  . omeprazole (PRILOSEC) 10 MG capsule Take 1 capsule (10 mg total) by mouth daily.  30 capsule  0  . Prenatal  Vit-Fe Fumarate-FA (PRENATAL MULTIVITAMIN) TABS tablet Take 1 tablet by mouth daily at 12 noon.      . traMADol (ULTRAM) 50 MG tablet Take 1 tablet (50 mg total) by mouth every 6 (six) hours as needed for moderate pain.  30 tablet  0  . fluconazole (DIFLUCAN) 150 MG tablet Take 1 tablet (150 mg total) by mouth daily.  1 tablet  0     Review of Systems  Pertinent pos/neg as indicated in HPI    Blood pressure 120/68, pulse 82, temperature 98.3 F (36.8 C), temperature source Oral, resp. rate 18, height  (1.6 m), weight 64.411 kg (142 lb), last menstrual period 12/24/2012. General appearance: alert, cooperative and no distress Lungs: clear to auscultation bilaterally Heart: regular rate and rhythm Abdomen: gravid, soft, non-tender Extremities: No edema DTR's 2+  Fetal monitoring: FHR: 140 bpm, variability: moderate,  Accelerations: Present,  decelerations:  Absent Uterine activity: 2-22mins  Dilation: 4.5 Effacement (%): 70 Station: -2 Exam by:: Claud Kelp RN Presentation: cephalic   Prenatal labs: ABO, Rh: --/--/O POS (08/30 0530) Antibody: NEG (08/30 0530) Rubella:   RPR: Nonreactive (05/14 0000)  HBsAg: Negative (05/14 0000)  HIV: Non-reactive (05/14 0000)  GBS: Negative (  08/21 0000)   1 hr Glucola: not done? Lapse in care or prenatal records Genetic screening: quad neg  Anatomy US: normal  Results for orders placed during the hospital encounter of 09/05/13 (from the past 24 hour(s))  URINALYSIS, ROUTINE W REFLEX MICROSCOPIC   Collection Time    09/05/13  3:10 AM      Result Value Ref Range   Color, Urine YELLOW  YELLOW   APPearance CLEAR  CLEAR   Specific Gravity, Urine 1.010  1.005 - 1.030   pH 6.5  5.0 - 8.0   Glucose, UA NEGATIVE  NEGATIVE mg/dL   Hgb urine dipstick SMALL (*) NEGATIVE   Bilirubin Urine NEGATIVE  NEGATIVE   Ketones, ur NEGATIVE  NEGATIVE mg/dL   Protein, ur NEGATIVE  NEGATIVE mg/dL   Urobilinogen, UA 0.2  0.0 - 1.0 mg/dL   Nitrite  NEGATIVE  NEGATIVE   Leukocytes, UA SMALL (*) NEGATIVE  URINE MICROSCOPIC-ADD ON   Collection Time    09/05/13  3:10 AM      Result Value Ref Range   Squamous Epithelial / LPF FEW (*) RARE   WBC, UA 3-6  <3 WBC/hpf   RBC / HPF 0-2  <3 RBC/hpf   Bacteria, UA RARE  RARE  GROUP B STREP BY PCR   Collection Time    09/05/13  5:10 AM      Result Value Ref Range   Group B strep by PCR NEGATIVE  NEGATIVE  CBC   Collection Time    09/05/13  5:30 AM      Result Value Ref Range   WBC 13.0 (*) 4.0 - 10.5 K/uL   RBC 3.45 (*) 3.87 - 5.11 MIL/uL   Hemoglobin 9.7 (*) 12.0 - 15.0 g/dL   HCT 40.9 (*) 81.1 - 91.4 %   MCV 87.0  78.0 - 100.0 fL   MCH 28.1  26.0 - 34.0 pg   MCHC 32.3  30.0 - 36.0 g/dL   RDW 78.2  95.6 - 21.3 %   Platelets 287  150 - 400 K/uL  SAMPLE TO BLOOD BANK   Collection Time    09/05/13  5:30 AM      Result Value Ref Range   Blood Bank Specimen SAMPLE AVAILABLE FOR TESTING     Sample Expiration 09/08/2013    TYPE AND SCREEN   Collection Time    09/05/13  5:30 AM      Result Value Ref Range   ABO/RH(D) O POS     Antibody Screen NEG     Sample Expiration 09/08/2013       Assessment:  [redacted]w[redacted]d SIUP  G3P1011  Spontaneous ptl  Cat 1 FHR  GBS Negative (08/21 0000)  Plan:  Admit to BS  IV pain meds for now, epidural prn if continues to progress  Anticipate NSVD    Marge Duncans CNM, WHNP-BC 09/05/2013, 7:08 AM

## 2013-09-05 NOTE — Progress Notes (Signed)
Patient ID: Allison [name], female   DOB: Oct 01, 1990, 23 y.o.   MRN: [id number] Allison [name] is a 23 y.o. G3P1011 at [redacted]w[redacted]d admitted for Preterm labor  Subjective: Uncomfortable w/ uc's, had been able to get a little rest, but uc's seem to be picking up again. Not ready for epidural yet, got fentanyl at 0630.   Objective: BP 120/68  [name] 82  Temp(Src) 98.3 F (36.8 C) (Oral)  Resp 18  Ht  (1.6 m)  Wt 64.411 kg (142 lb)  BMI 25.16 kg/m2  LMP 12/24/2012    FHT:  FHR: 125 bpm, variability: moderate,  accelerations:  Present,  decelerations:  Absent UC:   2-5  SVE:   Dilation: 5 Effacement (%): 80 Station: -1 Exam by:: kbooker, cnm  Labs: Lab Results  Component Value Date   WBC 13.0* 09/05/2013   HGB 9.7* 09/05/2013   HCT 30.0* 09/05/2013   MCV 87.0 09/05/2013   PLT 287 09/05/2013    Assessment / Plan: preterm labor, progressing slowly, ok to get epidural when she wants it, expectant management  Labor: Progressing normally Fetal Wellbeing:  Category I Pain Control:  Fentanyl Pre-eclampsia: n/a I/D:  Received pcn x 1, gbs pcr returned neg- so pcn stopped Anticipated MOD:  NSVD  Marge Duncans CNM, WHNP-BC 09/05/2013, 8:05 AM

## 2013-09-05 NOTE — Progress Notes (Signed)
Cynai Skeens is a 23 y.o. G3P1011 at [redacted]w[redacted]d admitted for Preterm labor  Subjective: Pt reports pain with contractions, unchanged since last cervical exam prior to 8 am.  Also reports some wetness, increasing with contractions, no gross leakage of fluid.   Objective: BP 120/68  Pulse 82  Temp(Src) 98.3 F (36.8 C) (Oral)  Resp 18  Ht  (1.6 m)  Wt 64.411 kg (142 lb)  BMI 25.16 kg/m2  LMP 12/24/2012      FHT:  FHR: 130 bpm, variability: moderate,  accelerations:  Present,  decelerations:  Absent UC:   Irritability, every 1-3 minutes, lasting 30-40 seconds, mild to palpation  SVE:   Deferred at this time, last exam by Joellyn Haff, CNM, 2.5 hours ago.  Perineum dry on inspection.  Will continue to evaluate fluid leakage.  Labs: Lab Results  Component Value Date   WBC 13.0* 09/05/2013   HGB 9.7* 09/05/2013   HCT 30.0* 09/05/2013   MCV 87.0 09/05/2013   PLT 287 09/05/2013    Assessment / Plan: PTL GBS negative by PCR  Labor: Observation at this time Preeclampsia:  n/a Fetal Wellbeing:  Category I Pain Control:  Pay may have epidural per Joellyn Haff, CNM I/D:  n/a Anticipated MOD:  NSVD  LEFTWICH-KIRBY, Xiara Knisley 09/05/2013, 10:39 AM

## 2013-09-20 ENCOUNTER — Encounter (HOSPITAL_COMMUNITY): Payer: Self-pay | Admitting: *Deleted

## 2013-09-20 ENCOUNTER — Inpatient Hospital Stay (HOSPITAL_COMMUNITY)
Admission: AD | Admit: 2013-09-20 | Discharge: 2013-09-22 | DRG: 775 | Disposition: A | Discharge status: Home / Self Care | Payer: Medicaid Other | Source: Ambulatory Visit | Attending: Family Medicine | Admitting: Family Medicine

## 2013-09-20 ENCOUNTER — Inpatient Hospital Stay (HOSPITAL_COMMUNITY): Payer: Medicaid Other | Admitting: Anesthesiology

## 2013-09-20 ENCOUNTER — Encounter (HOSPITAL_COMMUNITY): Payer: Medicaid Other | Admitting: Anesthesiology

## 2013-09-20 DIAGNOSIS — Z823 Family history of stroke: Secondary | ICD-10-CM

## 2013-09-20 DIAGNOSIS — O479 False labor, unspecified: Secondary | ICD-10-CM | POA: Diagnosis present

## 2013-09-20 DIAGNOSIS — Z833 Family history of diabetes mellitus: Secondary | ICD-10-CM

## 2013-09-20 DIAGNOSIS — D649 Anemia, unspecified: Secondary | ICD-10-CM | POA: Diagnosis present

## 2013-09-20 DIAGNOSIS — IMO0001 Reserved for inherently not codable concepts without codable children: Secondary | ICD-10-CM

## 2013-09-20 DIAGNOSIS — O9902 Anemia complicating childbirth: Secondary | ICD-10-CM | POA: Diagnosis present

## 2013-09-20 LAB — TYPE AND SCREEN
ABO/RH(D): O POS
Antibody Screen: NEGATIVE

## 2013-09-20 LAB — CBC
HCT: 31.4 % — ABNORMAL LOW (ref 36.0–46.0)
HEMOGLOBIN: 10 g/dL — AB (ref 12.0–15.0)
MCH: 26.8 pg (ref 26.0–34.0)
MCHC: 31.8 g/dL (ref 30.0–36.0)
MCV: 84.2 fL (ref 78.0–100.0)
Platelets: 333 10*3/uL (ref 150–400)
RBC: 3.73 MIL/uL — ABNORMAL LOW (ref 3.87–5.11)
RDW: 14.2 % (ref 11.5–15.5)
WBC: 15.4 10*3/uL — AB (ref 4.0–10.5)

## 2013-09-20 LAB — RPR

## 2013-09-20 MED ORDER — OXYCODONE-ACETAMINOPHEN 5-325 MG PO TABS
2.0000 | ORAL_TABLET | ORAL | Status: DC | PRN
  Administered 2013-09-21 (×2): 2 via ORAL
  Filled 2013-09-20 (×2): qty 2

## 2013-09-20 MED ORDER — FENTANYL 2.5 MCG/ML BUPIVACAINE 1/10 % EPIDURAL INFUSION (WH - ANES)
14.0000 mL/h | INTRAMUSCULAR | Status: DC | PRN
  Administered 2013-09-20: 14 mL/h via EPIDURAL
  Administered 2013-09-20: 12 mL/h via EPIDURAL
  Filled 2013-09-20 (×2): qty 125

## 2013-09-20 MED ORDER — OXYTOCIN BOLUS FROM INFUSION
500.0000 mL | INTRAVENOUS | Status: DC
Start: 2013-09-20 — End: 2013-09-20
  Administered 2013-09-20: 500 mL via INTRAVENOUS

## 2013-09-20 MED ORDER — PHENYLEPHRINE 40 MCG/ML (10ML) SYRINGE FOR IV PUSH (FOR BLOOD PRESSURE SUPPORT)
80.0000 ug | PREFILLED_SYRINGE | INTRAVENOUS | Status: DC | PRN
  Filled 2013-09-20: qty 2

## 2013-09-20 MED ORDER — WITCH HAZEL-GLYCERIN EX PADS: 1.0000 "application " | MEDICATED_PAD | CUTANEOUS | Status: DC | PRN

## 2013-09-20 MED ORDER — DIBUCAINE 1 % RE OINT: 1.0000 "application " | TOPICAL_OINTMENT | RECTAL | Status: DC | PRN

## 2013-09-20 MED ORDER — BENZOCAINE-MENTHOL 20-0.5 % EX AERO
1.0000 "application " | INHALATION_SPRAY | CUTANEOUS | Status: DC | PRN
  Administered 2013-09-20: 1 via TOPICAL
  Filled 2013-09-20: qty 56

## 2013-09-20 MED ORDER — LACTATED RINGERS IV SOLN
500.0000 mL | Freq: Once | INTRAVENOUS | Status: AC
  Administered 2013-09-20: 500 mL via INTRAVENOUS

## 2013-09-20 MED ORDER — OXYTOCIN 40 UNITS IN LACTATED RINGERS INFUSION - SIMPLE MED
62.5000 mL/h | INTRAVENOUS | Status: DC
  Filled 2013-09-20: qty 1000

## 2013-09-20 MED ORDER — SENNOSIDES-DOCUSATE SODIUM 8.6-50 MG PO TABS
2.0000 | ORAL_TABLET | ORAL | Status: DC
  Administered 2013-09-21 (×2): 2 via ORAL
  Filled 2013-09-20 (×2): qty 2

## 2013-09-20 MED ORDER — LANOLIN HYDROUS EX OINT: TOPICAL_OINTMENT | CUTANEOUS | Status: DC | PRN

## 2013-09-20 MED ORDER — PNEUMOCOCCAL VAC POLYVALENT 25 MCG/0.5ML IJ INJ
0.5000 mL | INJECTION | INTRAMUSCULAR | Status: DC
  Filled 2013-09-20: qty 0.5

## 2013-09-20 MED ORDER — ONDANSETRON HCL 4 MG PO TABS: 4.0000 mg | ORAL_TABLET | ORAL | Status: DC | PRN

## 2013-09-20 MED ORDER — CITRIC ACID-SODIUM CITRATE 334-500 MG/5ML PO SOLN
30.0000 mL | ORAL | Status: DC | PRN
  Administered 2013-09-20: 30 mL via ORAL
  Filled 2013-09-20: qty 15

## 2013-09-20 MED ORDER — LACTATED RINGERS IV SOLN
INTRAVENOUS | Status: DC
  Administered 2013-09-20: 03:00:00 via INTRAVENOUS

## 2013-09-20 MED ORDER — EPHEDRINE 5 MG/ML INJ
10.0000 mg | INTRAVENOUS | Status: DC | PRN
  Filled 2013-09-20: qty 2

## 2013-09-20 MED ORDER — DIPHENHYDRAMINE HCL 25 MG PO CAPS: 25.0000 mg | ORAL_CAPSULE | Freq: Four times a day (QID) | ORAL | Status: DC | PRN

## 2013-09-20 MED ORDER — IBUPROFEN 600 MG PO TABS
600.0000 mg | ORAL_TABLET | Freq: Four times a day (QID) | ORAL | Status: DC
  Administered 2013-09-20 – 2013-09-22 (×8): 600 mg via ORAL
  Filled 2013-09-20 (×9): qty 1

## 2013-09-20 MED ORDER — ZOLPIDEM TARTRATE 5 MG PO TABS: 5.0000 mg | ORAL_TABLET | Freq: Every evening | ORAL | Status: DC | PRN

## 2013-09-20 MED ORDER — DIPHENHYDRAMINE HCL 50 MG/ML IJ SOLN: 12.5000 mg | INTRAMUSCULAR | Status: DC | PRN

## 2013-09-20 MED ORDER — PRENATAL MULTIVITAMIN CH
1.0000 | ORAL_TABLET | Freq: Every day | ORAL | Status: DC
Start: 2013-09-20 — End: 2013-09-22
  Administered 2013-09-20 – 2013-09-22 (×3): 1 via ORAL
  Filled 2013-09-20 (×3): qty 1

## 2013-09-20 MED ORDER — SIMETHICONE 80 MG PO CHEW: 80.0000 mg | CHEWABLE_TABLET | ORAL | Status: DC | PRN

## 2013-09-20 MED ORDER — ONDANSETRON HCL 4 MG/2ML IJ SOLN: 4.0000 mg | Freq: Four times a day (QID) | INTRAMUSCULAR | Status: DC | PRN

## 2013-09-20 MED ORDER — TETANUS-DIPHTH-ACELL PERTUSSIS 5-2.5-18.5 LF-MCG/0.5 IM SUSP
0.5000 mL | Freq: Once | INTRAMUSCULAR | Status: AC
  Administered 2013-09-21: 0.5 mL via INTRAMUSCULAR
  Filled 2013-09-20: qty 0.5

## 2013-09-20 MED ORDER — LIDOCAINE HCL (PF) 1 % IJ SOLN
30.0000 mL | INTRAMUSCULAR | Status: AC | PRN
  Administered 2013-09-20 (×2): 5 mL via SUBCUTANEOUS

## 2013-09-20 MED ORDER — ACETAMINOPHEN 325 MG PO TABS: 650.0000 mg | ORAL_TABLET | ORAL | Status: DC | PRN

## 2013-09-20 MED ORDER — ONDANSETRON HCL 4 MG/2ML IJ SOLN: 4.0000 mg | INTRAMUSCULAR | Status: DC | PRN

## 2013-09-20 MED ORDER — OXYCODONE-ACETAMINOPHEN 5-325 MG PO TABS: 1.0000 | ORAL_TABLET | ORAL | Status: DC | PRN

## 2013-09-20 MED ORDER — LACTATED RINGERS IV SOLN: 500.0000 mL | INTRAVENOUS | Status: DC | PRN

## 2013-09-20 MED ORDER — LIDOCAINE HCL (PF) 1 % IJ SOLN
INTRAMUSCULAR | Status: AC
  Filled 2013-09-20: qty 30

## 2013-09-20 MED ORDER — PHENYLEPHRINE 40 MCG/ML (10ML) SYRINGE FOR IV PUSH (FOR BLOOD PRESSURE SUPPORT)
80.0000 ug | PREFILLED_SYRINGE | INTRAVENOUS | Status: DC | PRN
  Filled 2013-09-20: qty 10
  Filled 2013-09-20: qty 2

## 2013-09-20 NOTE — Progress Notes (Signed)
Patient refusing foley at this time stating she can still feel when she has the urge to void; explained to patient need for foley but patient requesting another hour before she get they foley catheter.

## 2013-09-20 NOTE — Progress Notes (Signed)
Allison [name] is a 23 y.o. G3P1011 at [redacted]w[redacted]d by ultrasound admitted for active labor  Subjective:   Objective: BP 119/70  Pulse 94  Temp(Src) 98.8 F (37.1 C) (Oral)  Resp 18  Ht  (1.6 m)  Wt 66.225 kg (146 lb)  BMI 25.87 kg/m2  SpO2 100%  LMP 12/24/2012      FHT:  FHR: 120 bpm, variability: moderate,  accelerations:  Present,  decelerations:  Absent UC:   irregular, every 1-2 minutes SVE: 5/90/-1 AROM clear fluid repeat exam 7/90/-1  Labs: Lab Results  Component Value Date   WBC 15.4* 09/20/2013   HGB 10.0* 09/20/2013   HCT 31.4* 09/20/2013   MCV 84.2 09/20/2013   PLT 333 09/20/2013    Assessment / Plan: Spontaneous labor, progressing normally  Labor: Progressing normally Preeclampsia:  no signs or symptoms of toxicity Fetal Wellbeing:  Category I Pain Control:  Epidural I/D:  n/a Anticipated MOD:  NSVD  [name] A 09/20/2013, 8:14 AM

## 2013-09-20 NOTE — Anesthesia Postprocedure Evaluation (Signed)
  Anesthesia Post-op Note  Patient: Allison [name]  Procedure(s) Performed: * No procedures listed *  Patient Location: Mother/Baby  Anesthesia Type:Epidural  Level of Consciousness: awake  Airway and Oxygen Therapy: Patient Spontanous Breathing  Post-op Pain: mild  Post-op Assessment: Patient's Cardiovascular Status Stable and Respiratory Function Stable  Post-op Vital Signs: stable  Last Vitals:  Filed Vitals:   09/20/13 1030  BP:   [name]:   Temp: 37.3 C  Resp:     Complications: No apparent anesthesia complications

## 2013-09-20 NOTE — Lactation Note (Signed)
This note was copied from the chart of Allison [name]. Lactation Consultation Note Initial visit at 13 hours of age.  Baby has had 4 feeding with adequate output.  MBU RN in room setting up double photo therapy.  Mom reports last feeding was only 15 minutes because she had visitors.  Encouraged mom to ask visitors to wait for feeding especially with jaundice feedings are a priority.  Encouraged mom to wake baby for feedings if needed.  Mom reports RN showed her how to do hand expression, but she has visitors in the room and declines demonstrations at this time. [facility] LC resources given and discussed.  Encouraged to feed with early cues on demand.  Early newborn behavior discussed.  Mom to call for assist as needed.    Patient Name: Allison Norabelle Kondo Today's Date: 09/20/2013 Reason for consult: Initial assessment   Maternal Data Has patient been taught Hand Expression?: Yes Does the patient have breastfeeding experience prior to this delivery?: Yes  Feeding Feeding Type: Breast Fed Length of feed: 15 min  LATCH Score/Interventions                      Lactation Tools Discussed/Used     Consult Status Consult Status: Follow-up Date: 09/21/13 Follow-up type: In-patient    Zykera Abella, Arvella Merles 09/20/2013, 10:17 PM

## 2013-09-20 NOTE — H&P (Signed)
Allison [name] is a 23 y.o. G3P1011 at GA 38.0 by [redacted]w[redacted]d sono presenting for contractions with onset at 9pm this evening. Denies any bright red vaginal bleeding. Reports frequent fetal movement, denies leakage of fluid. Reports n/v all day. Pt intends to breastfeed infant. Desires Nexplanon for contraception.   Maternal Medical History:  Reason for admission: Nausea.     OB History   Grav Para Term Preterm Abortions TAB SAB Ect Mult Living   Past Medical History  Diagnosis Date  . Medical history non-contributory    Past Surgical History  Procedure Laterality Date  . Eye surgery    . Induced abortion    . Eye surgery     Family History: family history includes Diabetes in her paternal uncle; Kidney disease in her brother and maternal grandfather; Stroke in her mother; Thyroid disease in her mother. Social History:  reports that she quit smoking about 8 months ago. Her smoking use included Cigarettes. She smoked 0.25 packs per day. She has never used smokeless tobacco. She reports that she drinks alcohol. She reports that she does not use illicit drugs.   Prenatal Transfer Tool  Maternal Diabetes: No Genetic Screening: Normal, Quad screen normal Maternal Ultrasounds/Referrals: Normal Fetal Ultrasounds or other Referrals:  None Maternal Substance Abuse:  Yes:  Type: Smoker, Marijuana Significant Maternal Medications:  None Significant Maternal Lab Results:  None Other Comments:  None  Review of Systems  Gastrointestinal: Positive for nausea, vomiting and abdominal pain.      Blood pressure 122/70, temperature 98 F (36.7 C), temperature source Oral, resp. rate 20, height  (1.6 m), weight 66.225 kg (146 lb), last menstrual period 12/24/2012. Maternal Exam:  Uterine Assessment: Contraction strength is moderate.  Contraction duration is 60 seconds. Contraction frequency is regular.   Abdomen: Fetal presentation: vertex  Introitus: Normal vulva. Normal  vagina.    Physical Exam  Constitutional: She is oriented to person, place, and time. She appears well-developed. She appears distressed.  HENT:  Head: Normocephalic.  Eyes: Conjunctivae and EOM are normal.  Cardiovascular: Normal rate, regular rhythm and normal heart sounds.   Respiratory: Effort normal and breath sounds normal. No respiratory distress.  GI: Soft. Bowel sounds are normal. There is no tenderness.  Musculoskeletal: She exhibits no edema.  Neurological: She is alert and oriented to person, place, and time. She has normal reflexes. She exhibits normal muscle tone.  Skin: Skin is warm and dry.    TOCO: regular contractions q1-2 minutes FHT: 135 baseline HR, moderate variability, + accelerations, no decelerations, category I strip  Prenatal labs: ABO, Rh: --/--/O POS, O POS (08/30 0530) Antibody: NEG (08/30 0530) Rubella: Immune (05/14 0000) RPR: NON REAC (08/30 0530)  HBsAg: Negative (05/14 0000)  HIV: Non-reactive (05/14 0000)  GBS: Negative (08/21 0000)   Assessment/Plan: Allison [name] is a 23 y.o. G3P1011 at GA 38.0 by [redacted]w[redacted]d sono presenting for contractions and found to be early labor.  1. IUP at 38.0. Admit for expectant management.  2. FWB. Category I strip.  3. Pain. Pt desires epidural.  4. MOF: Breast  5. MOC. Nexplanon  Pt was discussed with and seen and examined by Allison [name], CNM.  Allison [name] A 09/20/2013, 1:01 AM

## 2013-09-20 NOTE — Anesthesia Procedure Notes (Signed)
Epidural Patient location during procedure: OB Start time: 09/20/2013 1:47 AM End time: 09/20/2013 1:57 AM  Staffing Anesthesiologist: Xavi Tomasik, CHRIS Performed by: anesthesiologist   Preanesthetic Checklist Completed: patient identified, surgical consent, pre-op evaluation, timeout performed, IV checked, risks and benefits discussed and monitors and equipment checked  Epidural Patient position: sitting Prep: site prepped and draped and DuraPrep Patient monitoring: heart rate, cardiac monitor, continuous pulse ox and blood pressure Approach: midline Location: L4-L5 Injection technique: LOR saline  Needle:  Needle type: Tuohy  Needle gauge: 17 G Needle length: 9 cm Needle insertion depth: 6 cm Catheter type: closed end flexible Catheter size: 19 Gauge Catheter at skin depth: 12 cm Test dose: Other  Assessment Events: blood not aspirated, injection not painful, no injection resistance, negative IV test and no paresthesia  Additional Notes H+P and labs checked, risks and benefits discussed with the patient, consent obtained, procedure tolerated well and without complications.  Reason for block:procedure for pain

## 2013-09-20 NOTE — MAU Note (Signed)
Report called to birthing suites, charge RN line. Pt to transfer to room 163.

## 2013-09-20 NOTE — Plan of Care (Signed)
Problem: Consults Goal: Birthing Suites Patient Information Press F2 to bring up selections list  Outcome: Completed/Met Date Met:  09/20/13  [name]

## 2013-09-20 NOTE — Anesthesia Preprocedure Evaluation (Signed)
Anesthesia Evaluation  Patient identified by MRN, date of birth, ID band Patient awake    Reviewed: Allergy & Precautions, H&P , NPO status , Patient's Chart, lab work & pertinent test results  History of Anesthesia Complications Negative for: history of anesthetic complications  Airway Mallampati: I TM Distance: >3 FB Neck ROM: Full    Dental  (+) Teeth Intact   Pulmonary neg pulmonary ROS, former smoker,  breath sounds clear to auscultation        Cardiovascular negative cardio ROS  Rhythm:Regular     Neuro/Psych negative neurological ROS     GI/Hepatic negative GI ROS, Neg liver ROS,   Endo/Other  negative endocrine ROS  Renal/GU negative Renal ROS     Musculoskeletal   Abdominal   Peds  Hematology  (+) anemia ,   Anesthesia Other Findings   Reproductive/Obstetrics (+) Pregnancy                           Anesthesia Physical Anesthesia Plan  ASA: II  Anesthesia Plan: Epidural   Post-op Pain Management:    Induction:   Airway Management Planned:   Additional Equipment:   Intra-op Plan:   Post-operative Plan:   Informed Consent: I have reviewed the patients History and Physical, chart, labs and discussed the procedure including the risks, benefits and alternatives for the proposed anesthesia with the patient or authorized representative who has indicated his/her understanding and acceptance.     Plan Discussed with: Anesthesiologist  Anesthesia Plan Comments:         Anesthesia Quick Evaluation

## 2013-09-20 NOTE — MAU Note (Signed)
Pt states that intense contractions started at 9pm this evening. Denies any bright red vaginal bleeding, states positive frequent fetal movement, denies SROM/gush of fluids.States positive n/v all day.Denies diarrhea. Denies STI's and MRSA.

## 2013-09-21 NOTE — Lactation Note (Signed)
This note was copied from the chart of Allison [name]. Lactation Consultation Note: follow up visit with mom. She reports that her nipples are very sore- pink with raw areas on tips. Assisted mom with deeper latch and mom reports that feels better. Baby off to sleep. Mom reports that she is still hungry after most feedings. Wants to pump- DEBP set up for mom with instructions for use and cleaning. Will pump after she get baby back under bili lights. Another set of comfort gels given because they lost one. No questions at present. To call for assist prn  Patient Name: Allison [name] [id number] Date: 09/21/2013 Reason for consult: Follow-up assessment   Maternal Data Formula Feeding for Exclusion: No Does the patient have breastfeeding experience prior to this delivery?: Yes  Feeding Feeding Type: Breast Fed Length of feed: 20 min  LATCH Score/Interventions Latch: Grasps breast easily, tongue down, lips flanged, rhythmical sucking. Intervention(s): Adjust position;Assist with latch;Breast compression  Audible Swallowing: A few with stimulation  Type of Nipple: Everted at rest and after stimulation  Comfort (Breast/Nipple): Filling, red/small blisters or bruises, mild/mod discomfort  Problem noted: Mild/Moderate discomfort Interventions (Mild/moderate discomfort): Comfort gels  Hold (Positioning): Assistance needed to correctly position infant at breast and maintain latch. Intervention(s): Breastfeeding basics reviewed  LATCH Score: 7  Lactation Tools Discussed/Used Pump Review: Setup, frequency, and cleaning Initiated by:: DW Date initiated:: 09/21/13   Consult Status Consult Status: Complete    Pamelia Hoit 09/21/2013, 12:17 PM

## 2013-09-21 NOTE — Progress Notes (Signed)
Post Partum Day 1 Subjective:  Allison [name] is a 23 y.o. [id number] [redacted]w[redacted]d s/p normal spontaneous vaginal delivery.  No acute events overnight.  Pt denies problems with ambulating, voiding or po intake.  She denies nausea or vomiting.  Pain is moderately controlled.  She has had flatus. She has not had bowel movement.  Lochia Minimal.  Plan for birth control is Nexplanon.  Method of Feeding: Breast  Objective: Blood pressure 111/57, pulse 74, temperature 97.9 F (36.6 C), temperature source Oral, resp. rate 18, height  (1.6 m), weight 66.225 kg (146 lb), last menstrual period 12/24/2012, SpO2 100.00%, unknown if currently breastfeeding.  Physical Exam:  General: alert, cooperative and no distress Lochia:normal flow Abdomen: +BS, soft, nontender,  Uterine Fundus: firm DVT Evaluation: No evidence of DVT seen on physical exam. Extremities: no peripheral edema   Recent Labs  09/20/13 0100  HGB 10.0*  HCT 31.4*    Assessment/Plan:  ASSESSMENT: Allison [name] is a 23 y.o. [id number] [redacted]w[redacted]d s/p NSVD  Breastfeeding and Contraception Nexplanon   LOS: 1 day   Azucena Cecil, PA-S2 09/21/2013, 7:36 AM

## 2013-09-21 NOTE — Progress Notes (Signed)
Clinical Social Work Department PSYCHOSOCIAL ASSESSMENT - MATERNAL/CHILD 09/21/2013  Patient:  Allison [name], Allison [name]  Account Number:  [id number]  Admit Date:  09/20/2013  Allison [name] Name:   Allison [name]   Clinical Social Worker:  Lucita Ferrara, CLINICAL SOCIAL WORKER   Date/Time:  09/21/2013 09:15 AM  Date Referred:  09/20/2013   Referral source  Central Nursery     Referred reason  Substance Abuse   Other referral source:    I:  FAMILY / HOME ENVIRONMENT Child's legal guardian:  PARENT  Guardian - Name Guardian - Age Guardian - Address  Allison [name] [address] [place], [place] [address]  Allison [name]  same as above   Other household support members/support persons Name Relationship DOB   SON 54 years old   Other support:   MOB identified her mother, the FOB's parents, and her best friend as her primary supports. She stated that they are supportive, but also expressed that they also pressure her to make decisions based on their preferences which often causes her to feel like her voice is not heard.    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  MOB is looking forward to returning to work.   Financial resources:  Medicaid If Medicaid - County:  [place] / Grade:  N/A Music therapist / Child Services Coordination / Early Interventions:   CSW to make referral for [place].  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  Baby's UDS is negative and the meconium is  pending.  Housing Concerns Y N MOB and FOB will be moving to their own housing at the end of September.  MOB feels conflicted about where she and the baby will live in the interim.  She can live with either her mother or with the FOB's parents.    V  SOCIAL WORK ASSESSMENT CSW met with the MOB in order to complete the assessment. Consult was ordered due to MOB presenting with a history of THC. MOB was easily engaged in the assessment as CSW had met with the MOB in MAU in July.  MOB displayed a full range in affect and presented in an appropriate and pleasant mood.  The FOB was present in the room, but appeared to be sleeping throughout the visit.  MOB expressed appreciation for the visit since she does not feel like she can talk to any of her family members since they are opinionated and are biased.  She requested that [place] visit on 9/16 in order to follow-up prior to discharge.   MOB endorsed feeling very stressed and overwhelmed due to multiple psychosocial stressors.  MOB willingly processed the stressors with the CSW, and the CSW validated and normalized her feelings throughout the visit.  CSW guided the MOB to reflect upon her thoughts and feelings related to ongoing mediation due to her pending divorce and child custody "battle".  She continued to endorse housing concerns (as it had been a concern during the MAU  visit).  MOB stated that after she saw CSW in the MAU, she moved in with the FOB's parents.  She stated that she moved out of their home recently due to bed bugs.  MOB discussed second transition to her mother's home as her mother had recently returned to [place].  Per MOB, MOB and FOB will be moving to their own apartment at the end of the month, but it is still unknown where she will be living at time of discharge.  She is able to stay either at her mother's home or at the FOB's home, but there are pros/cons of staying at both of their homes.  CSW assisted MOB to process the pros/cons of  each situation, but MOB continued to be unsure where she wants to live in the interim since she knows that someone will be upset with her for her choice, and she does not want to make anyone upset.  CSW continued to assist MOB identify her personal values, and she stated that her children's happiness is most important to her.  CSW encouraged MOB to use this value as guidance when making the decision, and MOB discussed that she will need to continue to think about it during the day.  CSW inquired about MOB's ability to cope with these stressors.  MOB stated that she talks minimally with others about her feelings since she does not feel like she can talk to them without receiving unsolicited feedback.  MOB denied desire to talk to a therapist since she feels like she is doing "fine" with her coping.  MOB is not yet able to identify any negative consequences of internalizing her feelings.  Per MOB, she is looking forward to returning to the gym in order to work out since that was previously her way of coping with her feelings.   MOB acknowledged THC use early in her pregnancy. She denied questions or concerns related to the drug screen policy and potential CPS involvement.  Per MOB, she smoked THC to assist her with nausea.  She did not express belief that her THC use is problematic.   VI SOCIAL WORK PLAN Social Work Plan  Information/Referral to [employer] Education  Psychosocial Support/Ongoing Assessment of Needs   Type of pt/family education:   Postpartum depression and anxiety  Hospital drug screen policy   If child protective services report - county:   If child protective services report - date:   Information/referral to community resources comment:   CSW offered to make referral for therapy since MOB stated that she finds it difficult to process her feelings with others, but MOB denied need to seek out therapy at this time.   Other social work plan:   CSW to  follow-up with MOB on 9/16 per request of MOB in order to continue to discuss her housing concerns.

## 2013-09-21 NOTE — Progress Notes (Signed)
UR chart review completed.  

## 2013-09-22 MED ORDER — IBUPROFEN 600 MG PO TABS: 600.0000 mg | ORAL_TABLET | Freq: Four times a day (QID) | ORAL | Status: DC

## 2013-09-22 MED ORDER — PRENATAL MULTIVITAMIN CH: 1.0000 | ORAL_TABLET | Freq: Every day | ORAL | Status: DC

## 2013-09-22 NOTE — Progress Notes (Signed)
CSW met with the MOB and FOB in order to follow-up with conversation on 9/15.  MOB and FOB were both observed to be attentive and interacting with their daughter.   CSW inquired about MOB's thoughts and feelings related to where she wants to live in the interim while she waits for their housing to become available at the end of the month.  MOB stated that she will be staying with her mother, and she reported confidence in her decision.  She stated that it will be the best place for the health of her and her children.  CSW validated MOB's anticipated feelings of frustration that she may feel since her mother is "controlling", but MOB is able to express awareness that she only needs to stay with her mother for a short period of time since their home will be ready within the next week.   MOB and FOB denied additional questions or concerns.   No barriers to discharge.

## 2013-09-22 NOTE — Discharge Summary (Signed)
Obstetric Discharge Summary Reason for Admission: onset of labor Prenatal Procedures: ultrasound Intrapartum Procedures: spontaneous vaginal delivery Postpartum Procedures: none Complications-Operative and Postpartum: none Hemoglobin  Date Value Ref Range Status  09/20/2013 10.0* 12.0 - 15.0 g/dL Final     HCT  Date Value Ref Range Status  09/20/2013 31.4* 36.0 - 46.0 % Final   Hospital Course  Allison [name] is a 23 y.o. G3P1011 at GA 38.0 by [redacted]w[redacted]d sono who presented to MAU for contractions and was admitted for early onset of labor. She delivered a 8:33 AM on 09/14 a viable female was delivered via Vaginal, Spontaneous Delivery (Presentation: Right Occiput Anterior). APGAR:9 ,10 ;  Placenta status: Intact, Spontaneous. Cord: 3 vessel; with the following complications: none .  Anesthesia: Epidural  Episiotomy: None  Lacerations: None  Suture Repair: N/A  Est. Blood Loss (mL): 150  Pt has been breastfeeding. Desires Nexplanon for contraception.   Physical Exam:  General: alert, cooperative and no distress Lochia: appropriate Uterine Fundus: firm Incision: n/a DVT Evaluation: No evidence of DVT seen on physical exam.  Discharge Diagnoses: Term Pregnancy-delivered  Discharge Information: Date: 09/22/2013 Activity: unrestricted and pelvic rest Diet: routine Medications: Ibuprofen Condition: stable Instructions: refer to practice specific booklet Discharge to: home Follow-up Information   Follow up with [facility] In 5 weeks.   Specialty:  Occupational Therapy   Contact information:   [address] [place] [address] [phone]       Newborn Data: Live born female  Birth Weight: 6 lb 8.9 oz (2974 g) APGAR: 9, 10  Home with mother.  Tomma Rakers, MD, PGY3 09/22/2013, 7:35 AM I have seen the patient with the resident/student and agree with the above.  Tawnya Crook

## 2013-09-26 NOTE — H&P (Signed)
Agree with assessment. 

## 2013-09-27 NOTE — Progress Notes (Signed)
I saw and evaluated the patient. I agree with the findings and the plan of care as documented in the resident's note

## 2013-11-07 ENCOUNTER — Emergency Department (HOSPITAL_BASED_OUTPATIENT_CLINIC_OR_DEPARTMENT_OTHER)
Admission: EM | Admit: 2013-11-07 | Discharge: 2013-11-07 | Disposition: A | Discharge status: Home / Self Care | Payer: Medicaid Other | Attending: Emergency Medicine | Admitting: Emergency Medicine

## 2013-11-07 ENCOUNTER — Encounter (HOSPITAL_BASED_OUTPATIENT_CLINIC_OR_DEPARTMENT_OTHER): Payer: Self-pay | Admitting: *Deleted

## 2013-11-07 DIAGNOSIS — Z79899 Other long term (current) drug therapy: Secondary | ICD-10-CM | POA: Diagnosis not present

## 2013-11-07 DIAGNOSIS — Y9389 Activity, other specified: Secondary | ICD-10-CM | POA: Insufficient documentation

## 2013-11-07 DIAGNOSIS — S3992XA Unspecified injury of lower back, initial encounter: Secondary | ICD-10-CM | POA: Insufficient documentation

## 2013-11-07 DIAGNOSIS — S0990XA Unspecified injury of head, initial encounter: Secondary | ICD-10-CM | POA: Insufficient documentation

## 2013-11-07 DIAGNOSIS — Y92410 Unspecified street and highway as the place of occurrence of the external cause: Secondary | ICD-10-CM | POA: Diagnosis not present

## 2013-11-07 DIAGNOSIS — Z87891 Personal history of nicotine dependence: Secondary | ICD-10-CM | POA: Insufficient documentation

## 2013-11-07 DIAGNOSIS — S199XXA Unspecified injury of neck, initial encounter: Secondary | ICD-10-CM | POA: Diagnosis not present

## 2013-11-07 MED ORDER — ACETAMINOPHEN 325 MG PO TABS
975.0000 mg | ORAL_TABLET | Freq: Once | ORAL | Status: AC
  Administered 2013-11-07: 975 mg via ORAL
  Filled 2013-11-07: qty 3

## 2013-11-07 MED ORDER — ACETAMINOPHEN 325 MG PO TABS: 975.0000 mg | ORAL_TABLET | Freq: Once | ORAL | Status: DC
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
## 2013-11-07 NOTE — ED Notes (Signed)
Pt instructed to get undressed. 
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
## 2013-11-07 NOTE — ED Notes (Signed)
Too early to reassess pain med prior to discharge.

## 2013-11-08 ENCOUNTER — Encounter (HOSPITAL_BASED_OUTPATIENT_CLINIC_OR_DEPARTMENT_OTHER): Payer: Self-pay | Admitting: *Deleted

## 2015-10-24 ENCOUNTER — Encounter (HOSPITAL_BASED_OUTPATIENT_CLINIC_OR_DEPARTMENT_OTHER): Payer: Self-pay | Admitting: *Deleted

## 2015-10-24 ENCOUNTER — Emergency Department (HOSPITAL_BASED_OUTPATIENT_CLINIC_OR_DEPARTMENT_OTHER)
Admission: EM | Admit: 2015-10-24 | Discharge: 2015-10-25 | Disposition: A | Discharge status: Home / Self Care | Payer: Medicaid Other | Attending: Emergency Medicine | Admitting: Emergency Medicine

## 2015-10-24 DIAGNOSIS — R112 Nausea with vomiting, unspecified: Secondary | ICD-10-CM | POA: Insufficient documentation

## 2015-10-24 DIAGNOSIS — F17210 Nicotine dependence, cigarettes, uncomplicated: Secondary | ICD-10-CM | POA: Diagnosis not present

## 2015-10-24 LAB — COMPREHENSIVE METABOLIC PANEL
ALT: 23 U/L (ref 14–54)
AST: 31 U/L (ref 15–41)
Albumin: 4.6 g/dL (ref 3.5–5.0)
Alkaline Phosphatase: 46 U/L (ref 38–126)
Anion gap: 8 (ref 5–15)
BUN: 15 mg/dL (ref 6–20)
CALCIUM: 8.9 mg/dL (ref 8.9–10.3)
CHLORIDE: 107 mmol/L (ref 101–111)
CO2: 20 mmol/L — ABNORMAL LOW (ref 22–32)
Creatinine, Ser: 0.47 mg/dL (ref 0.44–1.00)
GFR calc non Af Amer: >60 mL/min (ref >60)
GLUCOSE: 93 mg/dL (ref 65–99)
POTASSIUM: 3.5 mmol/L (ref 3.5–5.1)
Sodium: 135 mmol/L (ref 135–145)
Total Bilirubin: 0.5 mg/dL (ref 0.3–1.2)
Total Protein: 8 g/dL (ref 6.5–8.1)

## 2015-10-24 LAB — URINALYSIS, ROUTINE W REFLEX MICROSCOPIC
Glucose, UA: NEGATIVE mg/dL
Hgb urine dipstick: NEGATIVE
KETONES UR: 15 mg/dL — AB
LEUKOCYTES UA: NEGATIVE
NITRITE: NEGATIVE
PROTEIN: NEGATIVE mg/dL
Specific Gravity, Urine: 1.037 — ABNORMAL HIGH (ref 1.005–1.030)
pH: 7.5 (ref 5.0–8.0)

## 2015-10-24 LAB — CBC
HEMATOCRIT: 38.3 % (ref 36.0–46.0)
HEMOGLOBIN: 13 g/dL (ref 12.0–15.0)
MCH: 31.1 pg (ref 26.0–34.0)
MCHC: 33.9 g/dL (ref 30.0–36.0)
MCV: 91.6 fL (ref 78.0–100.0)
PLATELETS: 286 10*3/uL (ref 150–400)
RBC: 4.18 MIL/uL (ref 3.87–5.11)
RDW: 12.7 % (ref 11.5–15.5)
WBC: 6.8 10*3/uL (ref 4.0–10.5)

## 2015-10-24 LAB — LIPASE, BLOOD: LIPASE: 20 U/L (ref 11–51)

## 2015-10-24 LAB — PREGNANCY, URINE: PREG TEST UR: NEGATIVE

## 2015-10-24 MED ORDER — ONDANSETRON HCL 4 MG/2ML IJ SOLN
4.0000 mg | Freq: Once | INTRAMUSCULAR | Status: AC | PRN
  Administered 2015-10-24: 4 mg via INTRAVENOUS
  Filled 2015-10-24: qty 2

## 2015-10-24 NOTE — ED Notes (Signed)
Pt describes N/V onset Fri. Vomited x 6 today. "Heartburn". Denies vaginal d/c. States she is not sexually active. Had birth control removed 2 months ago and had a period last month.

## 2015-10-24 NOTE — ED Triage Notes (Signed)
Patient is alert and oriented x4.  She is complaining of nausea and vomiting since last Friday.  Patient states she has not been able to keep anything down for almost a week.  Currently she is currently she adds that she has been having sharp abdominal pain intermittently for a year.

## 2015-10-25 MED ORDER — FAMOTIDINE IN NACL 20-0.9 MG/50ML-% IV SOLN
20.0000 mg | Freq: Once | INTRAVENOUS | Status: AC
  Administered 2015-10-25: 20 mg via INTRAVENOUS
  Filled 2015-10-25: qty 50

## 2015-10-25 MED ORDER — SODIUM CHLORIDE 0.9 % IV BOLUS (SEPSIS)
1000.0000 mL | Freq: Once | INTRAVENOUS | Status: AC
  Administered 2015-10-25: 1000 mL via INTRAVENOUS

## 2015-10-25 MED ORDER — ALUM & MAG HYDROXIDE-SIMETH 200-200-20 MG/5ML PO SUSP
30.0000 mL | Freq: Once | ORAL | Status: AC
  Administered 2015-10-25: 30 mL via ORAL
  Filled 2015-10-25: qty 30

## 2015-10-25 MED ORDER — ONDANSETRON 8 MG PO TBDP: 8.0000 mg | ORAL_TABLET | Freq: Three times a day (TID) | ORAL | 0 refills | Status: AC | PRN

## 2015-10-25 NOTE — ED Provider Notes (Signed)
[facility] Provider Note: Lowella Dell, MD, FACEP  CSN: [id number] MRN: [id number] ARRIVAL: 10/24/15 at 2131   CHIEF COMPLAINT  Vomiting   HISTORY OF PRESENT ILLNESS  Allison [name] is a 25 y.o. female with a four-day history of nausea and vomiting. The nausea and vomiting have been persistent. It is exacerbated by attempting to eat or drink. There was subjective fever and chills the first 2 days but these resolved. There is no associated diarrhea or acute abdominal cramping although she does have a history of right lower abdominal cramping for the past year; this occurs approximately every other day. She was given IV fluids and Zofran prior to my evaluation with improvement. She is still complaining of acid reflux.    Past Medical History:  Diagnosis Date  . Medical history non-contributory     Past Surgical History:  Procedure Laterality Date  . EYE SURGERY    . EYE SURGERY    . INDUCED ABORTION      Family History  Problem Relation Age of Onset  . Stroke Mother   . Thyroid disease Mother   . Kidney disease Brother   . Diabetes Paternal Uncle   . Kidney disease Maternal Grandfather     Social History  Substance Use Topics  . Smoking status: Current Some Day Smoker    Packs/day: 0.25    Types: Cigarettes    Last attempt to quit: 01/01/2013  . Smokeless tobacco: Never Used  . Alcohol use Yes     Comment: occassionaly while note pregnant    Prior to Admission medications   Medication Sig Start Date End Date Taking? Authorizing Provider  ondansetron (ZOFRAN ODT) 8 MG disintegrating tablet Take 1 tablet (8 mg total) by mouth every 8 (eight) hours as needed for nausea or vomiting. 10/25/15   Paula Libra, MD    Allergies Review of patient's allergies indicates no known allergies.   REVIEW OF SYSTEMS  Negative except as noted here or in the History of Present Illness.   PHYSICAL EXAMINATION  Initial Vital Signs Blood pressure 117/76, pulse 74,  temperature 98 F (36.7 C), temperature source Oral, resp. rate 18, last menstrual period 10/06/2015, SpO2 98 %, unknown if currently breastfeeding.  Examination General: Well-developed, well-nourished female in no acute distress; appearance consistent with age of record HENT: normocephalic; atraumatic Eyes: pupils equal, round and reactive to light; extraocular muscles intact Neck: supple Heart: regular rate and rhythm Lungs: clear to auscultation bilaterally Abdomen: soft; nondistended; nontender; no masses or hepatosplenomegaly; bowel sounds present Extremities: No deformity; full range of motion; pulses normal Neurologic: Awake, alert and oriented; motor function intact in all extremities and symmetric; no facial droop Skin: Warm and dry Psychiatric: Normal mood and affect   RESULTS  Summary of this visit's results, reviewed by myself:   EKG Interpretation  Date/Time:    Ventricular Rate:    PR Interval:    QRS Duration:   QT Interval:    QTC Calculation:   R Axis:     Text Interpretation:        Laboratory Studies: Results for orders placed or performed during the hospital encounter of 10/24/15 (from the past 24 hour(s))  Urinalysis, Routine w reflex microscopic (not at [facility])     Status: Abnormal   Collection Time: 10/24/15 11:20 PM  Result Value Ref Range   Color, Urine AMBER (A) YELLOW   APPearance CLOUDY (A) CLEAR   Specific Gravity, Urine 1.037 (H) 1.005 - 1.030   pH  7.5 5.0 - 8.0   Glucose, UA NEGATIVE NEGATIVE mg/dL   Hgb urine dipstick NEGATIVE NEGATIVE   Bilirubin Urine SMALL (A) NEGATIVE   Ketones, ur 15 (A) NEGATIVE mg/dL   Protein, ur NEGATIVE NEGATIVE mg/dL   Nitrite NEGATIVE NEGATIVE   Leukocytes, UA NEGATIVE NEGATIVE  Pregnancy, urine     Status: None   Collection Time: 10/24/15 11:20 PM  Result Value Ref Range   Preg Test, Ur NEGATIVE NEGATIVE  Lipase, blood     Status: None   Collection Time: 10/24/15 11:20 PM  Result Value Ref Range    Lipase 20 11 - 51 U/L  Comprehensive metabolic panel     Status: Abnormal   Collection Time: 10/24/15 11:20 PM  Result Value Ref Range   Sodium 135 135 - 145 mmol/L   Potassium 3.5 3.5 - 5.1 mmol/L   Chloride 107 101 - 111 mmol/L   CO2 20 (L) 22 - 32 mmol/L   Glucose, Bld 93 65 - 99 mg/dL   BUN 15 6 - 20 mg/dL   Creatinine, Ser 7.820.47 0.44 - 1.00 mg/dL   Calcium 8.9 8.9 - 95.610.3 mg/dL   Total Protein 8.0 6.5 - 8.1 g/dL   Albumin 4.6 3.5 - 5.0 g/dL   AST 31 15 - 41 U/L   ALT 23 14 - 54 U/L   Alkaline Phosphatase 46 38 - 126 U/L   Total Bilirubin 0.5 0.3 - 1.2 mg/dL   GFR calc non Af Amer >60 >60 mL/min   GFR calc Af Amer >60 >60 mL/min   Anion gap 8 5 - 15  CBC     Status: None   Collection Time: 10/24/15 11:20 PM  Result Value Ref Range   WBC 6.8 4.0 - 10.5 K/uL   RBC 4.18 3.87 - 5.11 MIL/uL   Hemoglobin 13.0 12.0 - 15.0 g/dL   HCT 21.338.3 08.636.0 - 57.846.0 %   MCV 91.6 78.0 - 100.0 fL   MCH 31.1 26.0 - 34.0 pg   MCHC 33.9 30.0 - 36.0 g/dL   RDW 46.912.7 62.911.5 - 52.815.5 %   Platelets 286 150 - 400 K/uL   Imaging Studies: No results found.  ED COURSE  Nursing notes and initial vitals signs, including pulse oximetry, reviewed.  Vitals:   10/24/15 2137 10/25/15 0005 10/25/15 0103  BP: 117/76 118/78 110/73  Pulse: 74 64 70  Resp: 18 18 16   Temp: 98 F (36.7 C)    TempSrc: Oral    SpO2: 98% 100% 100%   1:40 AM Drinking fluids without emesis after Zofran and 2 liter IV fluid bolus.  PROCEDURES    ED DIAGNOSES     ICD-9-CM ICD-10-CM   1. Nausea and vomiting in adult 787.01 R11.2        Paula LibraJohn Jacolyn Joaquin, MD 10/25/15 [phone]

## 2015-11-16 ENCOUNTER — Encounter (HOSPITAL_BASED_OUTPATIENT_CLINIC_OR_DEPARTMENT_OTHER): Payer: Self-pay

## 2015-11-16 ENCOUNTER — Emergency Department (HOSPITAL_BASED_OUTPATIENT_CLINIC_OR_DEPARTMENT_OTHER)
Admission: EM | Admit: 2015-11-16 | Discharge: 2015-11-16 | Disposition: A | Discharge status: Home / Self Care | Payer: Medicaid Other | Attending: Emergency Medicine | Admitting: Emergency Medicine

## 2015-11-16 ENCOUNTER — Emergency Department (HOSPITAL_BASED_OUTPATIENT_CLINIC_OR_DEPARTMENT_OTHER): Payer: Medicaid Other

## 2015-11-16 DIAGNOSIS — R109 Unspecified abdominal pain: Secondary | ICD-10-CM | POA: Diagnosis present

## 2015-11-16 DIAGNOSIS — F17210 Nicotine dependence, cigarettes, uncomplicated: Secondary | ICD-10-CM | POA: Diagnosis not present

## 2015-11-16 DIAGNOSIS — N12 Tubulo-interstitial nephritis, not specified as acute or chronic: Secondary | ICD-10-CM | POA: Diagnosis not present

## 2015-11-16 LAB — CBC WITH DIFFERENTIAL/PLATELET
Basophils Absolute: 0.1 10*3/uL (ref 0.0–0.1)
Basophils Relative: 0 %
EOS ABS: 0.3 10*3/uL (ref 0.0–0.7)
Eosinophils Relative: 2 %
HEMATOCRIT: 39.8 % (ref 36.0–46.0)
Hemoglobin: 13.4 g/dL (ref 12.0–15.0)
LYMPHS ABS: 2.3 10*3/uL (ref 0.7–4.0)
Lymphocytes Relative: 16 %
MCH: 30.7 pg (ref 26.0–34.0)
MCHC: 33.7 g/dL (ref 30.0–36.0)
MCV: 91.3 fL (ref 78.0–100.0)
MONOS PCT: 5 %
Monocytes Absolute: 0.8 10*3/uL (ref 0.1–1.0)
Neutro Abs: 11.4 10*3/uL — ABNORMAL HIGH (ref 1.7–7.7)
Neutrophils Relative %: 77 %
Platelets: 289 10*3/uL (ref 150–400)
RBC: 4.36 MIL/uL (ref 3.87–5.11)
RDW: 12.7 % (ref 11.5–15.5)
WBC: 14.8 10*3/uL — ABNORMAL HIGH (ref 4.0–10.5)

## 2015-11-16 LAB — URINALYSIS, ROUTINE W REFLEX MICROSCOPIC
Bilirubin Urine: NEGATIVE
GLUCOSE, UA: NEGATIVE mg/dL
KETONES UR: 15 mg/dL — AB
Nitrite: NEGATIVE
PH: 5.5 (ref 5.0–8.0)
Protein, ur: 30 mg/dL — AB
Specific Gravity, Urine: 1.012 (ref 1.005–1.030)

## 2015-11-16 LAB — URINE MICROSCOPIC-ADD ON

## 2015-11-16 LAB — PREGNANCY, URINE: Preg Test, Ur: NEGATIVE

## 2015-11-16 LAB — BASIC METABOLIC PANEL
Anion gap: 9 (ref 5–15)
BUN: 13 mg/dL (ref 6–20)
CO2: 22 mmol/L (ref 22–32)
CREATININE: 0.72 mg/dL (ref 0.44–1.00)
Calcium: 9.7 mg/dL (ref 8.9–10.3)
Chloride: 105 mmol/L (ref 101–111)
GFR calc Af Amer: >60 mL/min (ref >60)
GFR calc non Af Amer: >60 mL/min (ref >60)
GLUCOSE: 131 mg/dL — AB (ref 65–99)
Potassium: 3.4 mmol/L — ABNORMAL LOW (ref 3.5–5.1)
Sodium: 136 mmol/L (ref 135–145)

## 2015-11-16 MED ORDER — CIPROFLOXACIN HCL 500 MG PO TABS: 500.0000 mg | ORAL_TABLET | Freq: Two times a day (BID) | ORAL | 0 refills | Status: AC

## 2015-11-16 MED ORDER — CIPROFLOXACIN HCL 500 MG PO TABS
500.0000 mg | ORAL_TABLET | Freq: Once | ORAL | Status: AC
  Administered 2015-11-16: 500 mg via ORAL
  Filled 2015-11-16: qty 1

## 2015-11-16 NOTE — ED Triage Notes (Addendum)
Pt c/o left flank pain, urinary frequency and hematuria that started today.  No acute distress, eating chips in triage

## 2015-11-16 NOTE — ED Provider Notes (Signed)
[facility] Provider Note   CSN: [id number] Arrival date & time: 11/16/15  2034  By signing my name below, I, Allison [name], attest that this documentation has been prepared under the direction and in the presence of non-physician practitioner, Felicie Mornavid Tashima Scarpulla, NP . Electronically Signed: Nelwyn SalisburyJoshua [name], Scribe. 11/16/2015. 8:54 PM.  History   Chief Complaint Chief Complaint  Patient presents with  . Flank Pain   The history is provided by the patient. No language interpreter was used.  Flank Pain  This is a new problem. The current episode started 6 to 12 hours ago. The problem occurs constantly. The problem has been gradually worsening. Nothing aggravates the symptoms. Nothing relieves the symptoms. She has tried nothing for the symptoms. The treatment provided no relief.    HPI Comments:  Allison [name] is an otherwise healthy 25 y.o. female who presents to the Emergency Department complaining of sudden-onset constant gradually worsening left-sided flank pain beginning this morning.No modifying factors indicated. Pt reports associated urinary frequency and hematuria. She denies any nausea, vomiting, chills or fever. Pt is not currently on her period.  Past Medical History:  Diagnosis Date  . Medical history non-contributory     Patient Active Problem List   Diagnosis Date Noted  . Active labor at term 09/20/2013  . Preterm labor 09/05/2013  . Nausea and vomiting in pregnancy prior to [redacted] weeks gestation 02/20/2013  . Constipation 02/20/2013    Past Surgical History:  Procedure Laterality Date  . EYE SURGERY    . EYE SURGERY    . INDUCED ABORTION      OB History    Gravida Para Term Preterm AB Living   3 2 2   1 2    SAB TAB Ectopic Multiple Live Births     1     2       Home Medications    Prior to Admission medications   Medication Sig Start Date End Date Taking? Authorizing Provider  ondansetron (ZOFRAN ODT) 8 MG disintegrating tablet Take 1 tablet (8 mg  total) by mouth every 8 (eight) hours as needed for nausea or vomiting. 10/25/15   Paula LibraJohn Molpus, MD    Family History Family History  Problem Relation Age of Onset  . Stroke Mother   . Thyroid disease Mother   . Kidney disease Brother   . Diabetes Paternal Uncle   . Kidney disease Maternal Grandfather     Social History Social History  Substance Use Topics  . Smoking status: Current Some Day Smoker    Packs/day: 0.25    Types: Cigarettes    Last attempt to quit: 01/01/2013  . Smokeless tobacco: Never Used  . Alcohol use Yes     Comment: occassionaly while note pregnant     Allergies   Patient has no known allergies.   Review of Systems Review of Systems  Constitutional: Negative for chills and fever.  Gastrointestinal: Negative for nausea and vomiting.  Genitourinary: Positive for flank pain, frequency and hematuria.  All other systems reviewed and are negative.    Physical Exam Updated Vital Signs BP 124/86 (BP Location: Left Arm)   Pulse 82   Temp 98.9 F (37.2 C) (Oral)   Resp 18   Ht 5\' 4"  (1.626 m)   Wt 128 lb (58.1 kg)   LMP 10/22/2015 (Approximate)   SpO2 99%   BMI 21.97 kg/m   Physical Exam  Constitutional: She is oriented to person, place, and time. She appears well-developed and well-nourished.  No distress.  HENT:  Head: Normocephalic and atraumatic.  Eyes: Conjunctivae are normal.  Cardiovascular: Normal rate.   Pulmonary/Chest: Effort normal.  Abdominal: She exhibits no distension. There is CVA tenderness.  Left CVA tenderness.  Neurological: She is alert and oriented to person, place, and time.  Skin: Skin is warm and dry.  Psychiatric: She has a normal mood and affect.  Nursing note and vitals reviewed.    ED Treatments / Results  DIAGNOSTIC STUDIES:  Oxygen Saturation is 99% on RA, normal by my interpretation.    COORDINATION OF CARE:  9:00 PM Discussed treatment plan with pt at bedside which includes urinalysis and pt agreed  to plan.  Labs (all labs ordered are listed, but only abnormal results are displayed) Labs Reviewed  URINALYSIS, ROUTINE W REFLEX MICROSCOPIC (NOT AT [facility]) - Abnormal; Notable for the following:       Result Value   APPearance CLOUDY (*)    Hgb urine dipstick LARGE (*)    Ketones, ur 15 (*)    Protein, ur 30 (*)    Leukocytes, UA LARGE (*)    All other components within normal limits  CBC WITH DIFFERENTIAL/PLATELET - Abnormal; Notable for the following:    WBC 14.8 (*)    Neutro Abs 11.4 (*)    All other components within normal limits  BASIC METABOLIC PANEL - Abnormal; Notable for the following:    Potassium 3.4 (*)    Glucose, Bld 131 (*)    All other components within normal limits  URINE MICROSCOPIC-ADD ON - Abnormal; Notable for the following:    Squamous Epithelial / LPF 0-5 (*)    Bacteria, UA FEW (*)    All other components within normal limits  PREGNANCY, URINE    EKG  EKG Interpretation None       Radiology Ct Renal Stone Study  Result Date: 11/16/2015 CLINICAL DATA:  Urinary frequency with left flank pain EXAM: CT ABDOMEN AND PELVIS WITHOUT CONTRAST TECHNIQUE: Multidetector CT imaging of the abdomen and pelvis was performed following the standard protocol without IV contrast. COMPARISON:  None. FINDINGS: Lower chest: No acute abnormality. Hepatobiliary: No focal hepatic abnormalities. The gallbladder is markedly contracted. There is no biliary dilatation. Pancreas: Unremarkable. No pancreatic ductal dilatation or surrounding inflammatory changes. Spleen: Normal in size without focal abnormality. Adrenals/Urinary Tract: Adrenal glands within normal limits. Suggestion of punctate densities within both kidneys, likely small stones. There is no hydronephrosis. No hydroureter. There are a few punctate calcifications within the pelvis but these do not appear to be associated with the left ureter. The bladder is unremarkable. Stomach/Bowel: Stomach is within normal limits.  Appendix appears normal. No evidence of bowel wall thickening, distention, or inflammatory changes. Vascular/Lymphatic: No significant vascular findings are present. No enlarged abdominal or pelvic lymph nodes. Reproductive: Uterus and bilateral adnexa are unremarkable. Other: No abdominal wall hernia or abnormality. No abdominopelvic ascites. Musculoskeletal: No acute or significant osseous findings. IMPRESSION: 1. No evidence for hydronephrosis or ureteral calculus. Punctate densities in the kidneys are suggestive of tiny stones. 2. Otherwise negative non contrasted CT examination of the abdomen and pelvis. Electronically Signed   By: Jasmine Pang M.D.   On: 11/16/2015 22:00    Procedures Procedures (including critical care time)  Medications Ordered in ED Medications - No data to display   Initial Impression / Assessment and Plan / ED Course  I have reviewed the triage vital signs and the nursing notes.  Pertinent labs & imaging results that were  available during my care of the patient were reviewed by me and considered in my medical decision making (see chart for details).  Clinical Course   Pt diagnosed with a UTI/pyelonephritis. Pt is afebrile, without tachycardia, hypotension, or other signs of serious infection.  Pt to be dc home with antibiotics and instructions to follow up with PCP if symptoms persist. Discussed return precautions. Pt appears safe for discharge.    Final Clinical Impressions(s) / ED Diagnoses   Final diagnoses:  Pyelonephritis    New Prescriptions Discharge Medication List as of 11/16/2015 10:13 PM    START taking these medications   Details  ciprofloxacin (CIPRO) 500 MG tablet Take 1 tablet (500 mg total) by mouth 2 (two) times daily., Starting Thu 11/16/2015, Print        I personally performed the services described in this documentation, which was scribed in my presence. The recorded information has been reviewed and is accurate.    Felicie Mornavid Ashleymarie Granderson,  NP 11/16/15 [id number]    Geoffery Lyonsouglas Delo, MD 11/16/15 2233

## 2015-11-16 NOTE — ED Notes (Signed)
Pt returned from CT °

## 2016-06-05 ENCOUNTER — Encounter: Payer: Self-pay | Admitting: Physician Assistant

## 2016-06-06 ENCOUNTER — Emergency Department (HOSPITAL_BASED_OUTPATIENT_CLINIC_OR_DEPARTMENT_OTHER)
Admission: EM | Admit: 2016-06-06 | Discharge: 2016-06-06 | Disposition: A | Discharge status: Home / Self Care | Payer: Medicaid Other | Attending: Emergency Medicine | Admitting: Emergency Medicine

## 2016-06-06 ENCOUNTER — Encounter (HOSPITAL_BASED_OUTPATIENT_CLINIC_OR_DEPARTMENT_OTHER): Payer: Self-pay | Admitting: Emergency Medicine

## 2016-06-06 DIAGNOSIS — R112 Nausea with vomiting, unspecified: Secondary | ICD-10-CM

## 2016-06-06 DIAGNOSIS — F17210 Nicotine dependence, cigarettes, uncomplicated: Secondary | ICD-10-CM | POA: Insufficient documentation

## 2016-06-06 DIAGNOSIS — R1084 Generalized abdominal pain: Secondary | ICD-10-CM | POA: Diagnosis present

## 2016-06-06 DIAGNOSIS — G43A0 Cyclical vomiting, not intractable: Secondary | ICD-10-CM | POA: Diagnosis not present

## 2016-06-06 LAB — CBC WITH DIFFERENTIAL/PLATELET
BASOS PCT: 0 %
Basophils Absolute: 0 10*3/uL (ref 0.0–0.1)
Eosinophils Absolute: 0.4 10*3/uL (ref 0.0–0.7)
Eosinophils Relative: 5 %
HEMATOCRIT: 41.4 % (ref 36.0–46.0)
HEMOGLOBIN: 14.1 g/dL (ref 12.0–15.0)
LYMPHS PCT: 26 %
Lymphs Abs: 2 10*3/uL (ref 0.7–4.0)
MCH: 31.2 pg (ref 26.0–34.0)
MCHC: 34.1 g/dL (ref 30.0–36.0)
MCV: 91.6 fL (ref 78.0–100.0)
MONOS PCT: 8 %
Monocytes Absolute: 0.6 10*3/uL (ref 0.1–1.0)
NEUTROS ABS: 4.7 10*3/uL (ref 1.7–7.7)
NEUTROS PCT: 61 %
Platelets: 348 10*3/uL (ref 150–400)
RBC: 4.52 MIL/uL (ref 3.87–5.11)
RDW: 13.1 % (ref 11.5–15.5)
WBC: 7.6 10*3/uL (ref 4.0–10.5)

## 2016-06-06 LAB — COMPREHENSIVE METABOLIC PANEL
ALBUMIN: 4.3 g/dL (ref 3.5–5.0)
ALK PHOS: 41 U/L (ref 38–126)
ALT: 32 U/L (ref 14–54)
AST: 43 U/L — AB (ref 15–41)
Anion gap: 10 (ref 5–15)
BILIRUBIN TOTAL: 0.5 mg/dL (ref 0.3–1.2)
BUN: 12 mg/dL (ref 6–20)
CALCIUM: 9.6 mg/dL (ref 8.9–10.3)
CO2: 21 mmol/L — ABNORMAL LOW (ref 22–32)
Chloride: 106 mmol/L (ref 101–111)
Creatinine, Ser: 0.79 mg/dL (ref 0.44–1.00)
GFR calc Af Amer: >60 mL/min (ref >60)
GFR calc non Af Amer: >60 mL/min (ref >60)
GLUCOSE: 118 mg/dL — AB (ref 65–99)
Potassium: 3.3 mmol/L — ABNORMAL LOW (ref 3.5–5.1)
Sodium: 137 mmol/L (ref 135–145)
TOTAL PROTEIN: 7.2 g/dL (ref 6.5–8.1)

## 2016-06-06 LAB — URINALYSIS, MICROSCOPIC (REFLEX): RBC / HPF: NONE SEEN RBC/hpf (ref 0–5)

## 2016-06-06 LAB — URINALYSIS, ROUTINE W REFLEX MICROSCOPIC
BILIRUBIN URINE: NEGATIVE
GLUCOSE, UA: NEGATIVE mg/dL
HGB URINE DIPSTICK: NEGATIVE
Ketones, ur: 15 mg/dL — AB
Nitrite: NEGATIVE
PH: 8 (ref 5.0–8.0)
Protein, ur: NEGATIVE mg/dL
SPECIFIC GRAVITY, URINE: 1.025 (ref 1.005–1.030)

## 2016-06-06 LAB — PREGNANCY, URINE: Preg Test, Ur: NEGATIVE

## 2016-06-06 LAB — LIPASE, BLOOD: Lipase: 22 U/L (ref 11–51)

## 2016-06-06 MED ORDER — FAMOTIDINE 20 MG PO TABS
20.0000 mg | ORAL_TABLET | Freq: Once | ORAL | Status: DC
  Filled 2016-06-06: qty 1

## 2016-06-06 MED ORDER — MORPHINE SULFATE (PF) 4 MG/ML IV SOLN
4.0000 mg | Freq: Once | INTRAVENOUS | Status: AC
  Administered 2016-06-06: 4 mg via INTRAVENOUS
  Filled 2016-06-06: qty 1

## 2016-06-06 MED ORDER — POTASSIUM CHLORIDE CRYS ER 20 MEQ PO TBCR
40.0000 meq | EXTENDED_RELEASE_TABLET | Freq: Once | ORAL | Status: AC
  Administered 2016-06-06: 40 meq via ORAL
  Filled 2016-06-06: qty 2

## 2016-06-06 MED ORDER — ONDANSETRON HCL 4 MG/2ML IJ SOLN
4.0000 mg | Freq: Once | INTRAMUSCULAR | Status: AC
  Administered 2016-06-06: 4 mg via INTRAVENOUS
  Filled 2016-06-06: qty 2

## 2016-06-06 MED ORDER — SODIUM CHLORIDE 0.9 % IV BOLUS (SEPSIS)
1000.0000 mL | Freq: Once | INTRAVENOUS | Status: AC
  Administered 2016-06-06: 1000 mL via INTRAVENOUS

## 2016-06-06 MED ORDER — SUCRALFATE 1 G PO TABS: 1.0000 g | ORAL_TABLET | Freq: Three times a day (TID) | ORAL | 0 refills | Status: AC

## 2016-06-06 NOTE — Discharge Instructions (Signed)
Take your medication as prescribed as needed for pain relief. I recommend continuing to take your prescribed antibiotic until completed for your UTI. You may also continue taking her prescription of Zofran as needed for nausea. Continue drinking fluids at home to remain hydrated. I recommend following up with the gynecology clinic listed below for further management and reevaluation of your ovarian cysts. Please return to the Emergency Department if symptoms worsen or new onset of fever, new/worsening abdominal pain, vomiting blood, unable to keep fluids down.

## 2016-06-06 NOTE — ED Notes (Signed)
ED Provider at bedside. 

## 2016-06-06 NOTE — ED Provider Notes (Signed)
[facility] Provider Note   CSN: [id number] Arrival date & time: 06/06/16  1555   By signing my name below, I, Clarisse Gouge, attest that this documentation has been prepared under the direction and in the presence of Melburn Hake, [place]. Electronically signed, Clarisse Gouge, ED Scribe. 06/06/16. 5:35 PM.   History   Chief Complaint Chief Complaint  Patient presents with  . Abdominal Pain   The history is provided by the patient and medical records. No language interpreter was used.    Allison [name] is a 26 y.o. female presenting to the Emergency Department with chief complaint of abdominal pain constantly x 1 week in the epigastric > generalized regions. Associated nausea, vomit and constipation x 5 days reported. Burning, stabbing pain described that is worse with eating, drinking, walking and certain movements. Pt seen at [facility] ED 3 days ago with less severe symptoms where CAT scan, blood work and labs were performed. She states she was sent home with abx for UTI, tylenol/ codeine and nausea medications; she states she has taken all medications besides nausea medications. No relief noted from these attempted interventions. No other modifying factors noted. Pt states she has not imbibed ETOH in "a couple months". Pt unable to secure GI specialist evaluation d/t medicaid insurance coverage; she states none of the offices she has contacted accept this form of insurance. No h/o abdominal procedures noted. No fever, diarrhea, dysuria, hematuria or discharge noted. No other complaints at this time.   Past Medical History:  Diagnosis Date  . Medical history non-contributory     Patient Active Problem List   Diagnosis Date Noted  . Active labor at term 09/20/2013  . Preterm labor 09/05/2013  . Nausea and vomiting in pregnancy prior to [redacted] weeks gestation 02/20/2013  . Constipation 02/20/2013    Past Surgical History:  Procedure Laterality Date  . EYE SURGERY    . EYE  SURGERY    . INDUCED ABORTION      OB History    Gravida Para Term Preterm AB Living   3 2 2   1 2    SAB TAB Ectopic Multiple Live Births     1     2       Home Medications    Prior to Admission medications   Medication Sig Start Date End Date Taking? Authorizing Provider  acetaminophen-codeine 120-12 MG/5ML solution Take by mouth every 4 (four) hours as needed for moderate pain.   Yes [provider]  cephALEXin (KEFLEX) 500 MG capsule Take 500 mg by mouth 4 (four) times daily.   Yes [provider]  ondansetron (ZOFRAN) 4 MG tablet Take 4 mg by mouth every 8 (eight) hours as needed for nausea or vomiting.   Yes [provider]  ciprofloxacin (CIPRO) 500 MG tablet Take 1 tablet (500 mg total) by mouth 2 (two) times daily. 11/16/15   Felicie Morn, NP  ondansetron (ZOFRAN ODT) 8 MG disintegrating tablet Take 1 tablet (8 mg total) by mouth every 8 (eight) hours as needed for nausea or vomiting. 10/25/15   Molpus, John, MD  sucralfate (CARAFATE) 1 g tablet Take 1 tablet (1 g total) by mouth 4 (four) times daily -  with meals and at bedtime. 06/06/16   Barrett Henle, PA-C    Family History Family History  Problem Relation Age of Onset  . Stroke Mother   . Thyroid disease Mother   . Kidney disease Brother   . Diabetes Paternal Uncle   .  Kidney disease Maternal Grandfather     Social History Social History  Substance Use Topics  . Smoking status: Current Some Day Smoker    Packs/day: 0.25    Types: Cigarettes    Last attempt to quit: 01/01/2013  . Smokeless tobacco: Never Used  . Alcohol use Yes     Comment: occassionaly while note pregnant     Allergies   Patient has no known allergies.   Review of Systems Review of Systems  Constitutional: Negative for fever.  Gastrointestinal: Positive for abdominal pain, constipation, nausea and vomiting. Negative for diarrhea.  Genitourinary: Negative for dysuria, hematuria and vaginal  discharge.  All other systems reviewed and are negative.    Physical Exam Updated Vital Signs BP (!) 154/96 (BP Location: Right Arm)   Pulse 95   Temp 99.1 F (37.3 C) (Oral)   Resp (!) 40 Comment: patient is hyperventalating  Ht 5\' 4"  (1.626 m)   Wt 128 lb (58.1 kg)   LMP 05/13/2016   SpO2 100%   BMI 21.97 kg/m   Physical Exam  Constitutional: She is oriented to person, place, and time. She appears well-developed and well-nourished. No distress.  HENT:  Head: Normocephalic and atraumatic.  Mouth/Throat: Uvula is midline, oropharynx is clear and moist and mucous membranes are normal. No oropharyngeal exudate, posterior oropharyngeal edema, posterior oropharyngeal erythema or tonsillar abscesses. No tonsillar exudate.  Eyes: Conjunctivae and EOM are normal. Right eye exhibits no discharge. Left eye exhibits no discharge. No scleral icterus.  Neck: Normal range of motion. Neck supple.  Cardiovascular: Normal rate, regular rhythm, normal heart sounds and intact distal pulses.   Pulmonary/Chest: Effort normal and breath sounds normal. No respiratory distress. She has no wheezes. She has no rales. She exhibits no tenderness.  Abdominal: Soft. Bowel sounds are normal. She exhibits no distension and no mass. There is tenderness. There is no rebound and no guarding. No hernia.  Mild diffuse abdominal tenderness. No CVA tenderness.  Musculoskeletal: Normal range of motion. She exhibits no edema.  Neurological: She is alert and oriented to person, place, and time.  Skin: Skin is warm and dry. She is not diaphoretic.  Nursing note and vitals reviewed.    ED Treatments / Results  DIAGNOSTIC STUDIES: Oxygen Saturation is 100% on RA, NL by my interpretation.    COORDINATION OF CARE: 5:12 PM-Discussed next steps with pt. Pt verbalized understanding and is agreeable with the plan. Will order fluids, medications and labs noted.   Labs (all labs ordered are listed, but only abnormal  results are displayed) Labs Reviewed  COMPREHENSIVE METABOLIC PANEL - Abnormal; Notable for the following:       Result Value   Potassium 3.3 (*)    CO2 21 (*)    Glucose, Bld 118 (*)    AST 43 (*)    All other components within normal limits  URINALYSIS, ROUTINE W REFLEX MICROSCOPIC - Abnormal; Notable for the following:    APPearance TURBID (*)    Ketones, ur 15 (*)    Leukocytes, UA SMALL (*)    All other components within normal limits  URINALYSIS, MICROSCOPIC (REFLEX) - Abnormal; Notable for the following:    Bacteria, UA FEW (*)    Squamous Epithelial / LPF TOO NUMEROUS TO COUNT (*)    All other components within normal limits  CBC WITH DIFFERENTIAL/PLATELET  LIPASE, BLOOD  PREGNANCY, URINE    EKG  EKG Interpretation None       Radiology No results found.  Procedures Procedures (including critical care time)  Medications Ordered in ED Medications  sodium chloride 0.9 % bolus 1,000 mL (0 mLs Intravenous Stopped 06/06/16 1913)  ondansetron (ZOFRAN) injection 4 mg (4 mg Intravenous Given 06/06/16 1728)  morphine 4 MG/ML injection 4 mg (4 mg Intravenous Given 06/06/16 1728)  potassium chloride SA (K-DUR,KLOR-CON) CR tablet 40 mEq (40 mEq Oral Given 06/06/16 1811)     Initial Impression / Assessment and Plan / ED Course  I have reviewed the triage vital signs and the nursing notes.  Pertinent labs & imaging results that were available during my care of the patient were reviewed by me and considered in my medical decision making (see chart for details).     Patient presents with continued abdominal pain, nausea, vomiting and constipation that have been present for the past week. She reports she was seen at [facility] ED earlier this week where she was diagnosed with UTI and discharged home with antibiotics, nausea medicine and pain meds with GI follow-up. VSS. Exam revealed mild diffuse abdominal tenderness, no peritoneal signs. Patient given IV fluids, pain meds and  antiemetics.  Chart review shows that patient was seen at [facility] ED on 06/03/16 and 06/04/16 for similar abdominal pain and symptoms. CT abdomen showed no acute findings, labs unremarkable, UA consistent with UTI. Patient advised to follow up with GI and discharged home with Tylenol # 3, Keflex and Zofran.  Pregnancy negative. Labs revealed potassium 3.3, otherwise unremarkable. Patient given oral potassium supplement in the ED. UA appears consistent with mild UTI. On reevaluation patient reports improvement of symptoms. Patient tolerating PO. Due to patient with recent CT abdomen performed 3 days ago when she had similar onset of symptoms I do not feel any further workup or imaging is warranted at this time. Suspect patient's symptoms are likely related to GERD vs. Gastritis vs. PUD. Patient declined Pepcid in the ED and continued to request a prescription for narcotics. Patient was discharged home prescription for Carafate and advised to continue taking her prescription of Keflex and Zofran as prescribed. Patient advised to follow-up with GI for further evaluation. Discussed return precautions.  Final Clinical Impressions(s) / ED Diagnoses   Final diagnoses:  Generalized abdominal pain  Non-intractable vomiting with nausea, unspecified vomiting type    New Prescriptions Discharge Medication List as of 06/06/2016  8:41 PM    START taking these medications   Details  sucralfate (CARAFATE) 1 g tablet Take 1 tablet (1 g total) by mouth 4 (four) times daily -  with meals and at bedtime., Starting Thu 06/06/2016, Print       I personally performed the services described in this documentation, which was scribed in my presence. The recorded information has been reviewed and is accurate.    Barrett Henle, PA-C 06/07/16 0100    Tegeler, Canary Brim, MD 06/07/16 1329

## 2016-06-06 NOTE — ED Triage Notes (Signed)
Patient states that [facility] ago and was dx with multiple problems. Patient states that her stomach problems have not gotten any better and she has not "shit" in 5 days. Patient is anxious and writhing around in the triage chair

## 2016-06-11 ENCOUNTER — Ambulatory Visit: Payer: Medicaid Other | Admitting: Physician Assistant

## 2016-12-17 IMAGING — CT CT RENAL STONE PROTOCOL
2 of 4 series · 16 of 46 positions shown, 18 images · non-contrast
Comparison: None.

CLINICAL DATA: Urinary frequency with left flank pain

EXAM:
CT ABDOMEN AND PELVIS WITHOUT CONTRAST
TECHNIQUE: Multidetector CT imaging of the abdomen and pelvis was performed
following the standard protocol without IV contrast.

[Series 2: axial st · axial · 0.81mm/px · z∈[-583,-183]mm · 13 of 90 slices shown, 15 images]
[im 5/90  soft-tissue]
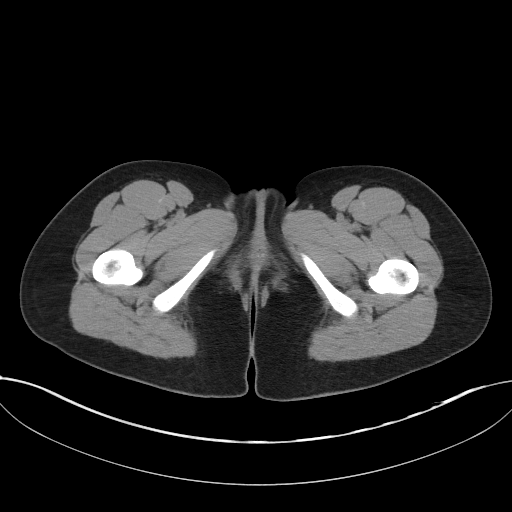
[im 5/90  bone]
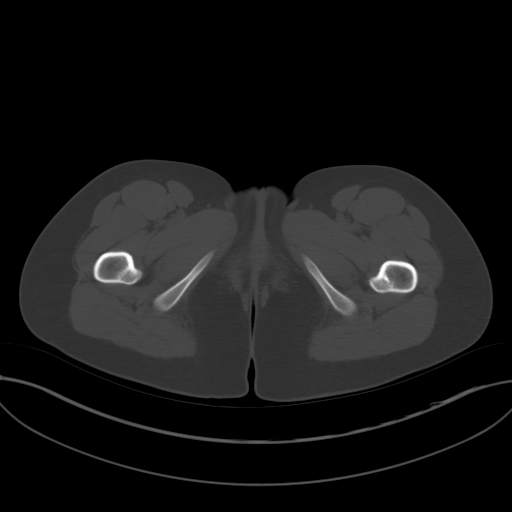
[im 13/90  soft-tissue]
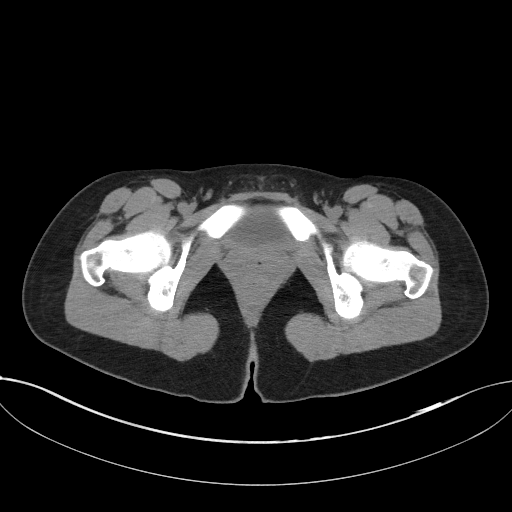
[im 21/90  soft-tissue]
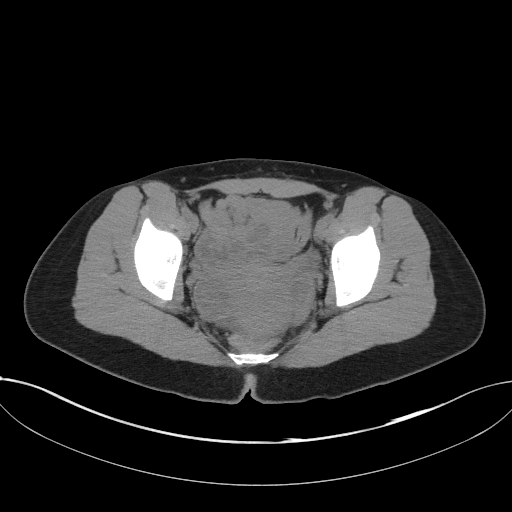
[im 25/90  soft-tissue]
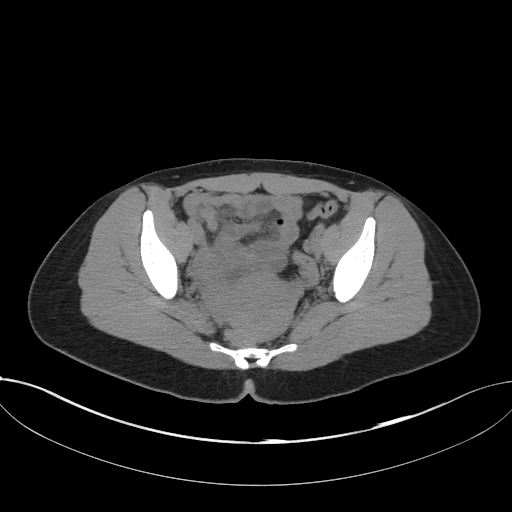
[im 33/90  soft-tissue]
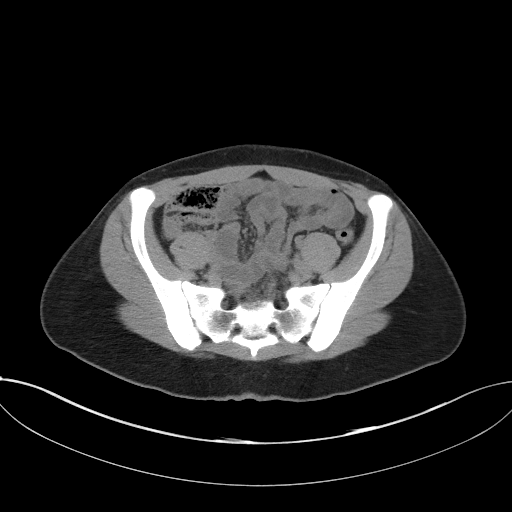
[im 37/90  soft-tissue]
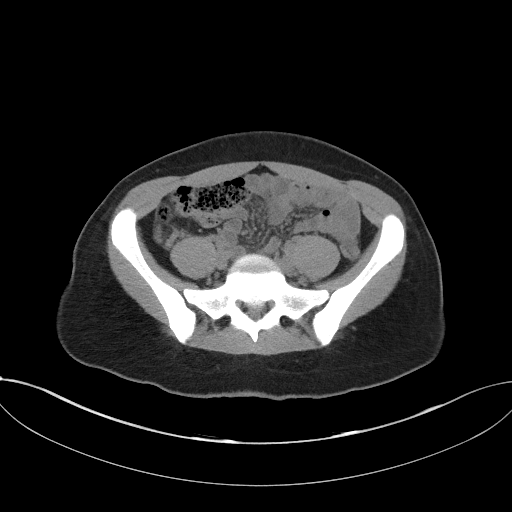
[im 45/90  soft-tissue]
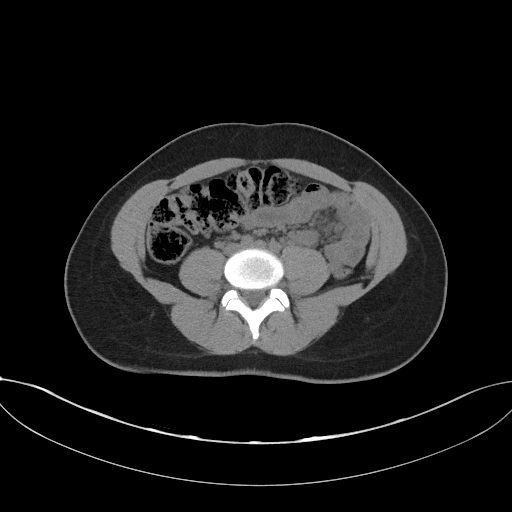
[im 53/90  soft-tissue]
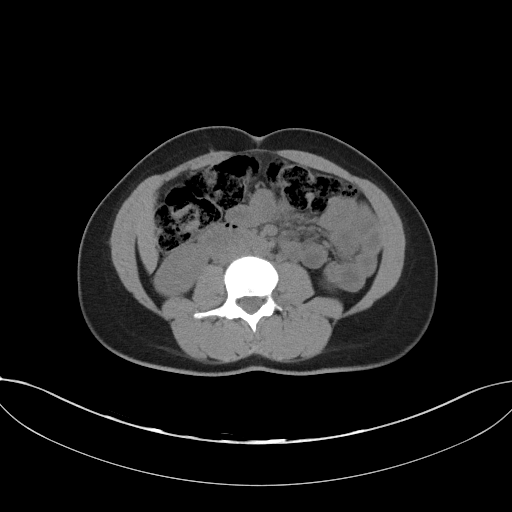
[im 57/90  soft-tissue]
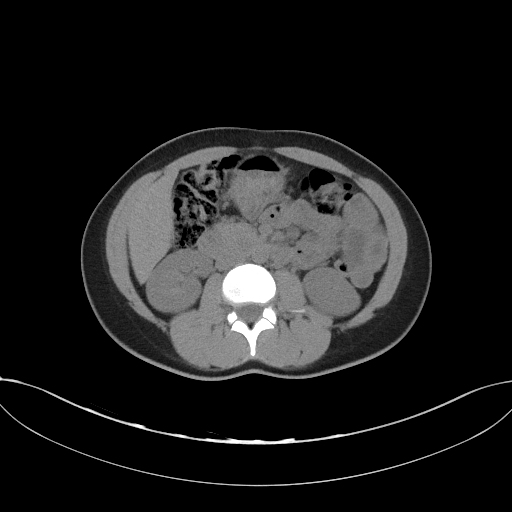
[im 57/90  bone]
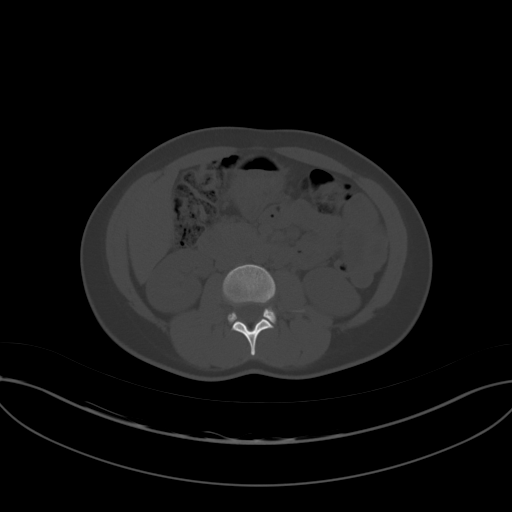
[im 65/90  soft-tissue]
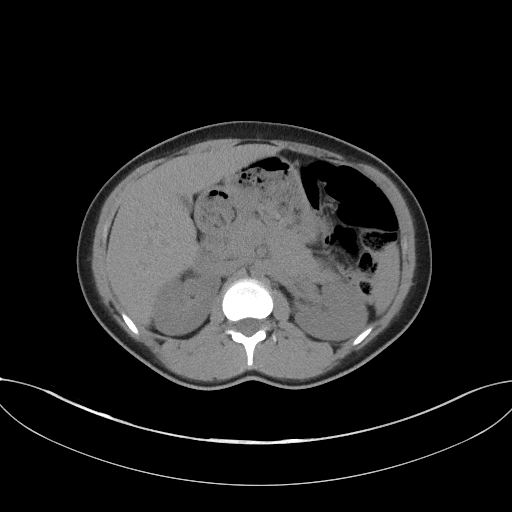
[im 69/90  soft-tissue]
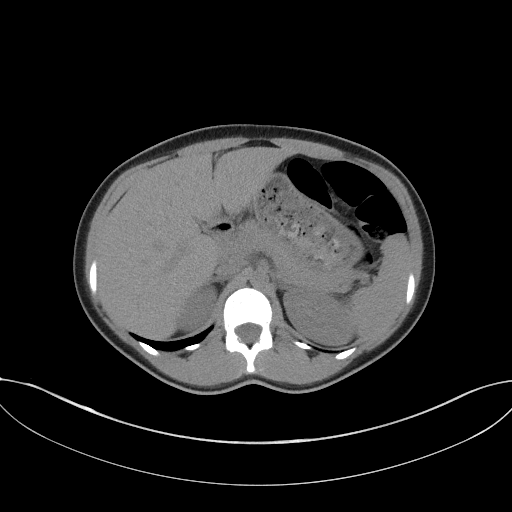
[im 77/90  soft-tissue]
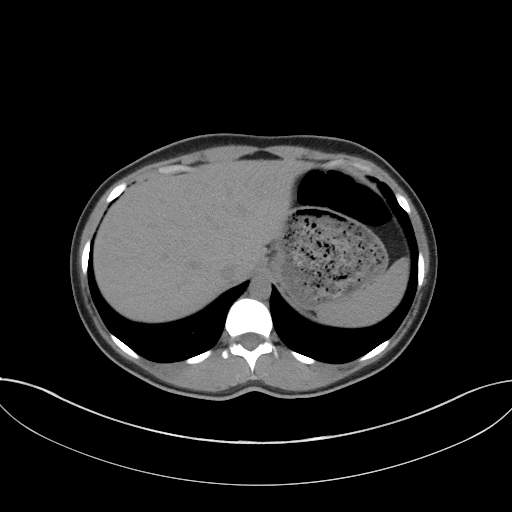
[im 85/90  soft-tissue]
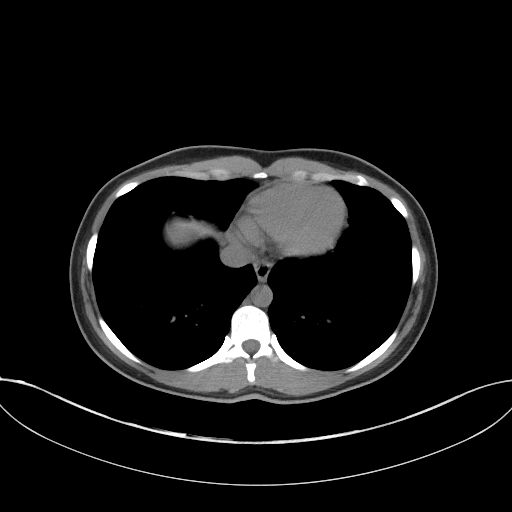

[Series 4: coronal st · coronal · 0.84mm/px · 3 of 69 slices shown]
[im 23/69  soft-tissue]
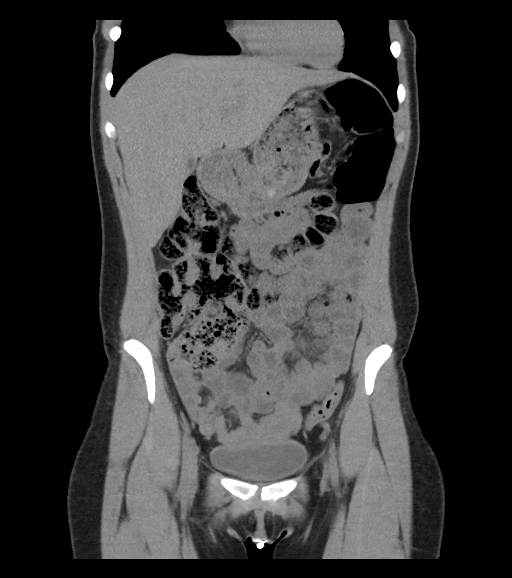
[im 31/69  soft-tissue]
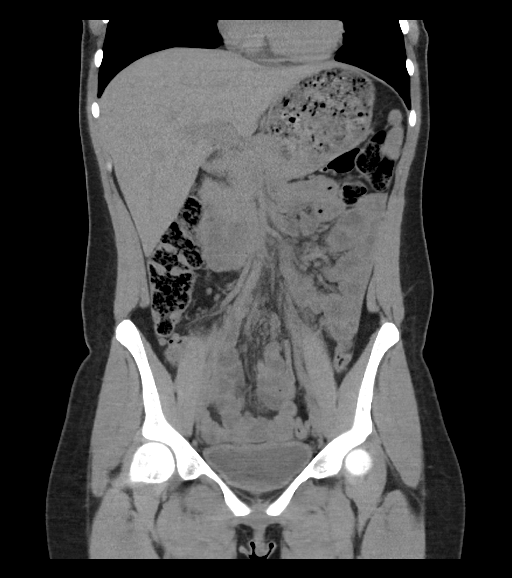
[im 38/69  soft-tissue]
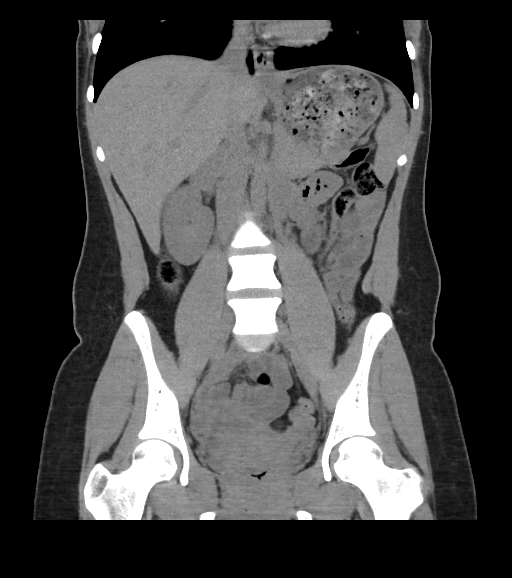

[16 of 46 positions shown; findings below may reference images not displayed]

FINDINGS: Lower chest: No acute abnormality.

Hepatobiliary: No focal hepatic abnormalities. The gallbladder is
markedly contracted. There is no biliary dilatation.

Pancreas: Unremarkable. No pancreatic ductal dilatation or
surrounding inflammatory changes.

Spleen: Normal in size without focal abnormality.

Adrenals/Urinary Tract: Adrenal glands within normal limits.
Suggestion of punctate densities within both kidneys, likely small
stones. There is no hydronephrosis. No hydroureter. There are a few
punctate calcifications within the pelvis but these do not appear to
be associated with the left ureter. The bladder is unremarkable.

Stomach/Bowel: Stomach is within normal limits. Appendix appears
normal. No evidence of bowel wall thickening, distention, or
inflammatory changes.

Vascular/Lymphatic: No significant vascular findings are present. No
enlarged abdominal or pelvic lymph nodes.

Reproductive: Uterus and bilateral adnexa are unremarkable.

Other: No abdominal wall hernia or abnormality. No abdominopelvic
ascites.

Musculoskeletal: No acute or significant osseous findings.
IMPRESSION: 1. No evidence for hydronephrosis or ureteral calculus. Punctate
densities in the kidneys are suggestive of tiny stones.
2. Otherwise negative non contrasted CT examination of the abdomen
and pelvis.
